# Patient Record
Sex: Female | Born: 1970 | Race: White | Hispanic: No | Marital: Married | Smoking: Current every day smoker
Health system: Southern US, Community
[De-identification: ages and names within clinical notes are randomized; demographics above are authoritative.]

## PROBLEM LIST (undated history)

## (undated) DIAGNOSIS — I10 Essential (primary) hypertension: Secondary | ICD-10-CM

## (undated) DIAGNOSIS — E1169 Type 2 diabetes mellitus with other specified complication: Principal | ICD-10-CM

## (undated) DIAGNOSIS — E669 Obesity, unspecified: Principal | ICD-10-CM

## (undated) HISTORY — PX: TONSILLECTOMY AND ADENOIDECTOMY: SUR1326

## (undated) HISTORY — DX: Type 2 diabetes mellitus with other specified complication: E11.69

## (undated) HISTORY — DX: Obesity, unspecified: E66.9

## (undated) HISTORY — DX: Essential (primary) hypertension: I10

---

## 2017-02-16 ENCOUNTER — Telehealth: Payer: Self-pay

## 2017-02-16 NOTE — Telephone Encounter (Signed)
Pre visit call attempted left message for return call.

## 2017-02-17 ENCOUNTER — Encounter: Payer: Self-pay | Admitting: Family Medicine

## 2017-02-17 ENCOUNTER — Ambulatory Visit (INDEPENDENT_AMBULATORY_CARE_PROVIDER_SITE_OTHER): Payer: BLUE CROSS/BLUE SHIELD | Admitting: Family Medicine

## 2017-02-17 VITALS — BP 122/84 | HR 78 | Temp 98.4°F | Ht 64.0 in | Wt 179.1 lb

## 2017-02-17 DIAGNOSIS — E669 Obesity, unspecified: Secondary | ICD-10-CM | POA: Diagnosis not present

## 2017-02-17 DIAGNOSIS — Z23 Encounter for immunization: Secondary | ICD-10-CM | POA: Diagnosis not present

## 2017-02-17 DIAGNOSIS — E1165 Type 2 diabetes mellitus with hyperglycemia: Secondary | ICD-10-CM | POA: Insufficient documentation

## 2017-02-17 DIAGNOSIS — E1169 Type 2 diabetes mellitus with other specified complication: Secondary | ICD-10-CM

## 2017-02-17 HISTORY — DX: Obesity, unspecified: E66.9

## 2017-02-17 HISTORY — DX: Type 2 diabetes mellitus with other specified complication: E11.69

## 2017-02-17 MED ORDER — ATORVASTATIN CALCIUM 40 MG PO TABS: 40.0000 mg | ORAL_TABLET | Freq: Every day | ORAL | 3 refills | Status: DC

## 2017-02-17 MED ORDER — METFORMIN HCL 500 MG PO TABS: ORAL_TABLET | ORAL | 1 refills | Status: DC

## 2017-02-17 NOTE — Addendum Note (Signed)
Addended by: Scharlene GlossEWING, Keishia Ground B on: 02/17/2017 04:21 PM   Modules accepted: Orders

## 2017-02-17 NOTE — Progress Notes (Signed)
Pre visit review using our clinic review tool, if applicable. No additional management support is needed unless otherwise documented below in the visit note. 

## 2017-02-17 NOTE — Addendum Note (Signed)
Addended by: Harley AltoPRICE, KRISTY M on: 02/17/2017 04:14 PM   Modules accepted: Orders

## 2017-02-17 NOTE — Patient Instructions (Addendum)
Check your sugars twice daily and write them down.  Give [place] 2-3 business days to get the results of your labs back. If labs are normal, you will likely receive a letter in the mail unless you have MyChart. This can take longer than 2-3 business days.   Let me know if you are ready to stop smoking and would like help.  Aim to do some physical exertion for 150 minutes per week. This is typically divided into 5 days per week, 30 minutes per day. The activity should be enough to get your heart rate up. Anything is better than nothing if you have time constraints.  If you do not hear anything about your referrals in the next 1-2 weeks, call our office and ask for an update.  Let us know if you need anything.  Healthy Eating Plan Many factors influence your heart health, including eating and exercise habits. Heart (coronary) risk increases with abnormal blood fat (lipid) levels. Heart-healthy meal planning includes limiting unhealthy fats, increasing healthy fats, and making other small dietary changes. This includes maintaining a healthy body weight to help keep lipid levels within a normal range.  WHAT IS MY PLAN?  Your health care provider recommends that you:  Drink a glass of water before meals to help with satiety.  Eat slowly.  An alternative to the water is to add Metamucil. This will help with satiety as well. It does contain calories, unlike water.  WHAT TYPES OF FAT SHOULD I CHOOSE?  Choose healthy fats more often. Choose monounsaturated and polyunsaturated fats, such as olive oil and canola oil, flaxseeds, walnuts, almonds, and seeds.  Eat more omega-3 fats. Good choices include salmon, mackerel, sardines, tuna, flaxseed oil, and ground flaxseeds. Aim to eat fish at least two times each week.  Avoid foods with partially hydrogenated oils in them. These contain trans fats. Examples of foods that contain trans fats are stick margarine, some tub margarines, cookies, crackers, and  other baked goods. If you are going to avoid a fat, this is the one to avoid!  WHAT GENERAL GUIDELINES DO I NEED TO FOLLOW?  Check food labels carefully to identify foods with trans fats. Avoid these types of options when possible.  Fill one half of your plate with vegetables and green salads. Eat 4-5 servings of vegetables per day. A serving of vegetables equals 1 cup of raw leafy vegetables,  cup of raw or cooked cut-up vegetables, or  cup of vegetable juice.  Fill one fourth of your plate with whole grains. Look for the word "whole" as the first word in the ingredient list.  Fill one fourth of your plate with lean protein foods.  Eat 4-5 servings of fruit per day. A serving of fruit equals one medium whole fruit,  cup of dried fruit,  cup of fresh, frozen, or canned fruit. Try to avoid fruits in cups/syrups as the sugar content can be high.  Eat more foods that contain soluble fiber. Examples of foods that contain this type of fiber are apples, broccoli, carrots, beans, peas, and barley. Aim to get 20-30 g of fiber per day.  Eat more home-cooked food and less restaurant, buffet, and fast food.  Limit or avoid alcohol.  Limit foods that are high in starch and sugar.  Avoid fried foods when able.  Cook foods by using methods other than frying. Baking, boiling, grilling, and broiling are all great options. Other fat-reducing suggestions include: ? Removing the skin from poultry. ? Removing all  visible fats from meats. ? Skimming the fat off of stews, soups, and gravies before serving them. ? Steaming vegetables in water or broth.  Lose weight if you are overweight. Losing just 5-10% of your initial body weight can help your overall health and prevent diseases such as diabetes and heart disease.  Increase your consumption of nuts, legumes, and seeds to 4-5 servings per week. One serving of dried beans or legumes equals  cup after being cooked, one serving of nuts equals 1 ounces,  and one serving of seeds equals  ounce or 1 tablespoon.  WHAT ARE GOOD FOODS CAN I EAT? Grains Grainy breads (try to find bread that is 3 g of fiber per slice or greater), oatmeal, light popcorn. Whole-grain cereals. Rice and pasta, including brown rice and those that are made with whole wheat. Edamame pasta is a great alternative to grain pasta. It has a higher protein content. Try to avoid significant consumption of white bread, sugary cereals, or pastries/baked goods.  Vegetables All vegetables. Cooked white potatoes do not count as vegetables.  Fruits All fruits, but limit pineapple and bananas as these fruits have a higher sugar content.  Meats and Other Protein Sources Lean, well-trimmed beef, veal, pork, and lamb. Chicken and [place] without skin. All fish and shellfish. Wild duck, rabbit, pheasant, and venison. Egg whites or low-cholesterol egg substitutes. Dried beans, peas, lentils, and tofu.Seeds and most nuts.  Dairy Low-fat or nonfat cheeses, including ricotta, string, and mozzarella. Skim or 1% milk that is liquid, powdered, or evaporated. Buttermilk that is made with low-fat milk. Nonfat or low-fat yogurt. Soy/Almond milk are good alternatives if you cannot handle dairy.  Beverages Water is the best for you. Sports drinks with less sugar are more desirable unless you are a highly active athlete.  Sweets and Desserts Sherbets and fruit ices. Honey, jam, marmalade, jelly, and syrups. Dark chocolate.  Eat all sweets and desserts in moderation.  Fats and Oils Nonhydrogenated (trans-free) margarines. Vegetable oils, including soybean, sesame, sunflower, olive, peanut, safflower, corn, canola, and cottonseed. Salad dressings or mayonnaise that are made with a vegetable oil. Limit added fats and oils that you use for cooking, baking, salads, and as spreads.  Other Cocoa powder. Coffee and tea. Most condiments.  The items listed above may not be a complete list of recommended  foods or beverages. Contact your dietitian for more options.

## 2017-02-17 NOTE — Progress Notes (Signed)
Chief Complaint  Patient presents with  . Establish Care       New Patient Visit SUBJECTIVE: HPI: Brooke Medina is an 46 y.o.female who is being seen for establishing care.  The patient was previously seen at Dr [facility]- solo practitioner.   Recently dx'd with diabetes by OB/GYN. Never had an eye exam since dx'd. No PCV23.  Flu shot 2 weeks ago. She does not have a glucometer. A1c in July, 2018 was 9.5. No medications and not on insulin. Diet is improving. She is physically active at work but no scheduled exercise.   Allergies  Allergen Reactions  . Latex   . Penicillins Rash  . Sulfa Antibiotics Rash    Past Medical History:  Diagnosis Date  . Diabetes mellitus type 2 in obese (HCC) 02/17/2017  . Hypertension     Past Surgical History:  Procedure Laterality Date  . TONSILLECTOMY AND ADENOIDECTOMY     Social History   Social History  . Marital status: Married   Social History Main Topics  . Smoking status: Current Every Day Smoker  . Smokeless tobacco: Never Used  . Alcohol use Yes  . Drug use: No   Family History  Problem Relation Age of Onset  . Diabetes Mother   . Hyperlipidemia Mother   . Hypertension Mother   . Kidney disease Father   . Diabetes Father   . Hypertension Father   . Heart murmur Father   . Diabetes Maternal Grandmother   . Diabetes Maternal Grandfather   . Hypertension Paternal Grandmother   . Heart disease Paternal Grandmother   . Diabetes Paternal Grandfather   . Hypertension Paternal Grandfather     Current Outpatient Prescriptions:  .  ALPRAZolam (XANAX) 0.25 MG tablet, Take 0.25 mg by mouth as needed for anxiety., Disp: , Rfl:  .  medroxyPROGESTERone (DEPO-PROVERA) 150 MG/ML injection, Inject 150 mg into the muscle every 3 (three) months., Disp: , Rfl:  .  atorvastatin (LIPITOR) 40 MG tablet, Take 1 tablet (40 mg total) by mouth daily., Disp: 90 tablet, Rfl: 3 .  metFORMIN (GLUCOPHAGE) 500 MG tablet, Week 1: 1 tab daily Week 2:  1 tab twice daily Week 3: 2 tabs in AM, 1 in evening Week 4: 2 tabs twice daily, Disp: 120 tablet, Rfl: 1  ROS Cardiovascular: Denies chest pain  Respiratory: Denies dyspnea   OBJECTIVE: BP 122/84 (BP Location: Left Arm, Patient Position: Sitting, Cuff Size: Normal)   Pulse 78   Temp 98.4 F (36.9 C) (Oral)   Ht 5\' 4"  (1.626 m)   Wt 179 lb 2 oz (81.3 kg)   SpO2 99%   BMI 30.75 kg/m   Constitutional: -  VS reviewed -  Well developed, well nourished, appears stated age -  No apparent distress  Psychiatric: -  Oriented to person, place, and time -  Memory intact -  Affect and mood normal -  Fluent conversation, good eye contact -  Judgment and insight age appropriate  Eye: -  Conjunctivae clear, no discharge -  Pupils symmetric, round, reactive to light  ENMT: -  MMM    Pharynx moist, no exudate, no erythema  Neck: -  No gross swelling, no palpable masses -  Thyroid midline, not enlarged, mobile, no palpable masses  Cardiovascular: -  RRR -  No LE edema  Respiratory: -  Normal respiratory effort, no accessory muscle use, no retraction -  Breath sounds equal, no wheezes, no ronchi, no crackles  Musculoskeletal: -  No clubbing,  no cyanosis -  Gait normal  Skin: -  No significant lesion on inspection -  Warm and dry to palpation   ASSESSMENT/PLAN: Diabetes mellitus type 2 in obese (HCC) - Plan: metFORMIN (GLUCOPHAGE) 500 MG tablet, atorvastatin (LIPITOR) 40 MG tablet, Ambulatory referral to Endocrinology, Microalbumin / creatinine urine ratio, Lipid panel, Hemoglobin A1c, Comprehensive metabolic panel, CBC, Ambulatory referral to Ophthalmology  Patient instructed to sign release of records form from her previous PCP. GYN rx's Xanax. 120 will last year.  Counseled on diet and exercise. Labs today. She is requesting referral to endo. Healthy diet hand out given.  Start statin and try Metformin.  Glucometer given today.  Patient should return prn as she follows with GYN for  yearly and will follow with Endo for DM. The patient voiced understanding and agreement to the plan.   Jilda Rocheicholas Paul NeiltonWendling, DO 02/17/17  4:10 PM

## 2017-02-18 LAB — CBC
HCT: 39.6 % (ref 35.0–45.0)
HEMOGLOBIN: 13.9 g/dL (ref 11.7–15.5)
MCH: 30.7 pg (ref 27.0–33.0)
MCHC: 35.1 g/dL (ref 32.0–36.0)
MCV: 87.4 fL (ref 80.0–100.0)
MPV: 9.7 fL (ref 7.5–12.5)
Platelets: 345 10*3/uL (ref 140–400)
RBC: 4.53 10*6/uL (ref 3.80–5.10)
RDW: 12 % (ref 11.0–15.0)
WBC: 12.7 10*3/uL — ABNORMAL HIGH (ref 3.8–10.8)

## 2017-02-18 LAB — COMPREHENSIVE METABOLIC PANEL
AG RATIO: 1.5 (calc) (ref 1.0–2.5)
ALT: 21 U/L (ref 6–29)
AST: 16 U/L (ref 10–30)
Albumin: 4.3 g/dL (ref 3.6–5.1)
Alkaline phosphatase (APISO): 53 U/L (ref 33–115)
BILIRUBIN TOTAL: 0.4 mg/dL (ref 0.2–1.2)
BUN: 19 mg/dL (ref 7–25)
CALCIUM: 9.6 mg/dL (ref 8.6–10.2)
CO2: 25 mmol/L (ref 20–32)
Chloride: 104 mmol/L (ref 98–110)
Creat: 0.73 mg/dL (ref 0.50–1.10)
GLUCOSE: 217 mg/dL — AB (ref 65–99)
Globulin: 2.8 g/dL (calc) (ref 1.9–3.7)
Potassium: 5.1 mmol/L (ref 3.5–5.3)
Sodium: 138 mmol/L (ref 135–146)
Total Protein: 7.1 g/dL (ref 6.1–8.1)

## 2017-02-18 LAB — HEMOGLOBIN A1C
EAG (MMOL/L): 11.1 (calc)
Hgb A1c MFr Bld: 8.6 % of total Hgb — ABNORMAL HIGH (ref <5.7)
Mean Plasma Glucose: 200 (calc)

## 2017-02-18 LAB — LIPID PANEL
Cholesterol: 186 mg/dL (ref <200)
HDL: 50 mg/dL — AB (ref >50)
LDL Cholesterol (Calc): 107 mg/dL (calc) — ABNORMAL HIGH
Non-HDL Cholesterol (Calc): 136 mg/dL (calc) — ABNORMAL HIGH (ref <130)
TRIGLYCERIDES: 174 mg/dL — AB (ref <150)
Total CHOL/HDL Ratio: 3.7 (calc) (ref <5.0)

## 2017-02-18 LAB — MICROALBUMIN / CREATININE URINE RATIO
Creatinine, Urine: 120 mg/dL (ref 20–275)
MICROALB/CREAT RATIO: 16 ug/mg{creat} (ref <30)
Microalb, Ur: 1.9 mg/dL

## 2017-02-20 ENCOUNTER — Other Ambulatory Visit: Payer: Self-pay | Admitting: Family Medicine

## 2017-02-20 DIAGNOSIS — D72829 Elevated white blood cell count, unspecified: Secondary | ICD-10-CM

## 2017-02-27 ENCOUNTER — Other Ambulatory Visit: Payer: Self-pay | Admitting: Family Medicine

## 2017-02-27 ENCOUNTER — Other Ambulatory Visit: Payer: BLUE CROSS/BLUE SHIELD

## 2017-02-27 ENCOUNTER — Other Ambulatory Visit (INDEPENDENT_AMBULATORY_CARE_PROVIDER_SITE_OTHER): Payer: BLUE CROSS/BLUE SHIELD

## 2017-02-27 DIAGNOSIS — D72829 Elevated white blood cell count, unspecified: Secondary | ICD-10-CM | POA: Diagnosis not present

## 2017-02-27 LAB — CBC WITH DIFFERENTIAL/PLATELET
BASOS PCT: 0.4 % (ref 0.0–3.0)
Basophils Absolute: 0.1 10*3/uL (ref 0.0–0.1)
Eosinophils Absolute: 0.1 10*3/uL (ref 0.0–0.7)
Eosinophils Relative: 0.5 % (ref 0.0–5.0)
HCT: 43.8 % (ref 36.0–46.0)
HEMOGLOBIN: 14.9 g/dL (ref 12.0–15.0)
LYMPHS ABS: 4.6 10*3/uL — AB (ref 0.7–4.0)
LYMPHS PCT: 33.3 % (ref 12.0–46.0)
MCHC: 33.9 g/dL (ref 30.0–36.0)
MCV: 90.2 fl (ref 78.0–100.0)
MONOS PCT: 6.6 % (ref 3.0–12.0)
Monocytes Absolute: 0.9 10*3/uL (ref 0.1–1.0)
NEUTROS ABS: 8.3 10*3/uL — AB (ref 1.4–7.7)
Neutrophils Relative %: 59.2 % (ref 43.0–77.0)
Platelets: 384 10*3/uL (ref 150.0–400.0)
RBC: 4.86 Mil/uL (ref 3.87–5.11)
RDW: 13.2 % (ref 11.5–15.5)
WBC: 14 10*3/uL — ABNORMAL HIGH (ref 4.0–10.5)

## 2017-02-28 ENCOUNTER — Other Ambulatory Visit: Payer: BLUE CROSS/BLUE SHIELD

## 2017-02-28 DIAGNOSIS — D729 Disorder of white blood cells, unspecified: Secondary | ICD-10-CM

## 2017-03-01 LAB — PATHOLOGIST SMEAR REVIEW

## 2017-03-08 ENCOUNTER — Telehealth: Payer: Self-pay | Admitting: Family Medicine

## 2017-03-08 NOTE — Telephone Encounter (Signed)
Copied from CRM #[id number]. Topic: Quick Communication - See Telephone Encounter >> Mar 08, 2017  9:33 AM Rudi CocoLathan, Othelia Riederer M, NT wrote: CRM for notification. See Telephone encounter for:   03/08/17. Pt. called to see if she can get a refill on methocarbamol 500mg  1 tablet twice a day for 7 days   [facility] [id number] - [place], [place] - [address] [facility] [address] [place] [id number] Phone: [phone] Fax: [phone] Open 24 hours

## 2017-03-08 NOTE — Telephone Encounter (Signed)
Why does she need this? I don't have it on her list. TY.
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
## 2017-04-19 ENCOUNTER — Encounter (HOSPITAL_BASED_OUTPATIENT_CLINIC_OR_DEPARTMENT_OTHER): Payer: Self-pay | Admitting: Emergency Medicine

## 2017-04-19 ENCOUNTER — Other Ambulatory Visit: Payer: Self-pay

## 2017-04-19 ENCOUNTER — Emergency Department (HOSPITAL_BASED_OUTPATIENT_CLINIC_OR_DEPARTMENT_OTHER)
Admission: EM | Admit: 2017-04-19 | Discharge: 2017-04-19 | Disposition: A | Discharge status: Home / Self Care | Payer: BLUE CROSS/BLUE SHIELD | Attending: Emergency Medicine | Admitting: Emergency Medicine

## 2017-04-19 DIAGNOSIS — Z79899 Other long term (current) drug therapy: Secondary | ICD-10-CM | POA: Diagnosis not present

## 2017-04-19 DIAGNOSIS — Z91040 Latex allergy status: Secondary | ICD-10-CM | POA: Diagnosis not present

## 2017-04-19 DIAGNOSIS — J029 Acute pharyngitis, unspecified: Secondary | ICD-10-CM | POA: Diagnosis present

## 2017-04-19 DIAGNOSIS — Z7984 Long term (current) use of oral hypoglycemic drugs: Secondary | ICD-10-CM | POA: Insufficient documentation

## 2017-04-19 DIAGNOSIS — J020 Streptococcal pharyngitis: Secondary | ICD-10-CM | POA: Insufficient documentation

## 2017-04-19 DIAGNOSIS — F17210 Nicotine dependence, cigarettes, uncomplicated: Secondary | ICD-10-CM | POA: Insufficient documentation

## 2017-04-19 DIAGNOSIS — E119 Type 2 diabetes mellitus without complications: Secondary | ICD-10-CM | POA: Diagnosis not present

## 2017-04-19 DIAGNOSIS — I10 Essential (primary) hypertension: Secondary | ICD-10-CM | POA: Diagnosis not present

## 2017-04-19 LAB — RAPID STREP SCREEN (MED CTR MEBANE ONLY): Streptococcus, Group A Screen (Direct): POSITIVE — AB

## 2017-04-19 MED ORDER — CHLORHEXIDINE GLUCONATE 0.12 % MT SOLN: 15.0000 mL | Freq: Two times a day (BID) | OROMUCOSAL | 0 refills | Status: DC

## 2017-04-19 MED ORDER — CLINDAMYCIN HCL 150 MG PO CAPS: 150.0000 mg | ORAL_CAPSULE | Freq: Four times a day (QID) | ORAL | 0 refills | Status: DC

## 2017-04-19 MED FILL — CHLORHEXIDINE 0.12% RINSE: 0.12 | 16 days supply | Qty: 473 | Fill #0

## 2017-04-19 MED FILL — CLINDAMYCIN HCL 150 MG CAPS: 150 | 7 days supply | Qty: 28 | Fill #0

## 2017-04-19 NOTE — ED Provider Notes (Signed)
[facility] [place] EMERGENCY DEPARTMENT Provider Note   CSN: [id number] Arrival date & time: 04/19/17  0800     History   Chief Complaint Chief Complaint  Patient presents with  . Sore Throat    HPI Brooke Medina is a 47 y.o. female.  HPI   47 year old obese female with history of diabetes and hypertension presenting for evaluation of a sore throat.  For the past 3-4 days she has had progressive worsening sore throat, cough productive with yellow sputum, fever as high as 101, and generalized body aches.  Symptoms mildly improved with Tylenol at home.  No report of ear pain, runny nose, sneezing, shortness of breath, abdominal cramping, nausea vomiting or diarrhea.  Prior history of tonsillectomy and adenectomy.  Denies any recent sick contact.  Patient is a half a pack day smoker.  Past Medical History:  Diagnosis Date  . Diabetes mellitus type 2 in obese (HCC) 02/17/2017  . Hypertension     Patient Active Problem List   Diagnosis Date Noted  . Diabetes mellitus type 2 in obese (HCC) 02/17/2017    Past Surgical History:  Procedure Laterality Date  . TONSILLECTOMY AND ADENOIDECTOMY      OB History    No data available       Home Medications    Prior to Admission medications   Medication Sig Start Date End Date Taking? Authorizing Provider  ALPRAZolam Prudy Feeler(XANAX) 0.25 MG tablet Take 0.25 mg by mouth as needed for anxiety.    [provider]  ALPRAZolam Prudy Feeler(XANAX) 0.5 MG tablet Take 0.5 mg at bedtime as needed by mouth for anxiety.    [provider]  atenolol-chlorthalidone (TENORETIC) 50-25 MG tablet Take 1 tablet daily by mouth.    [provider]  atorvastatin (LIPITOR) 40 MG tablet Take 1 tablet (40 mg total) by mouth daily. 02/17/17   Sharlene DoryWendling, Nicholas Paul, DO  medroxyPROGESTERone (DEPO-PROVERA) 150 MG/ML injection Inject 150 mg into the muscle every 3 (three) months.    [provider]  metFORMIN (GLUCOPHAGE) 500 MG tablet  Week 1: 1 tab daily Week 2: 1 tab twice daily Week 3: 2 tabs in AM, 1 in evening Week 4: 2 tabs twice daily 02/17/17   Sharlene DoryWendling, Nicholas Paul, DO    Family History Family History  Problem Relation Age of Onset  . Diabetes Mother   . Hyperlipidemia Mother   . Hypertension Mother   . Kidney disease Father   . Diabetes Father   . Hypertension Father   . Heart murmur Father   . Diabetes Maternal Grandmother   . Diabetes Maternal Grandfather   . Hypertension Paternal Grandmother   . Heart disease Paternal Grandmother   . Diabetes Paternal Grandfather   . Hypertension Paternal Grandfather     Social History Social History   Tobacco Use  . Smoking status: Current Every Day Smoker    Packs/day: 0.50  . Smokeless tobacco: Never Used  Substance Use Topics  . Alcohol use: Yes  . Drug use: No     Allergies   Latex; Penicillins; and Sulfa antibiotics   Review of Systems Review of Systems  All other systems reviewed and are negative.    Physical Exam Updated Vital Signs BP (!) 129/92 (BP Location: Right Arm)   Pulse 90   Temp 98.5 F (36.9 C) (Oral)   Resp 18   Ht 5\' 4"  (1.626 m)   Wt 79.4 kg (175 lb)   SpO2 95%   BMI 30.04 kg/m  Physical Exam  Constitutional: She appears well-developed and well-nourished. No distress.  HENT:  Head: Atraumatic.  Right Ear: Tympanic membrane normal.  Left Ear: Tympanic membrane normal.  Mouth/Throat: Mucous membranes are normal. No uvula swelling. Posterior oropharyngeal erythema present. No oropharyngeal exudate, posterior oropharyngeal edema or tonsillar abscesses. No tonsillar exudate.  Eyes: Conjunctivae are normal.  Neck: Neck supple.  Cardiovascular: Normal rate and regular rhythm.  Pulmonary/Chest: Effort normal and breath sounds normal.  Abdominal: Soft. There is no tenderness.  Lymphadenopathy:    She has cervical adenopathy.  Neurological: She is alert.  Skin: No rash noted.  Psychiatric: She has a normal mood and  affect.  Nursing note and vitals reviewed.    ED Treatments / Results  Labs (all labs ordered are listed, but only abnormal results are displayed) Labs Reviewed  RAPID STREP SCREEN (NOT AT [facility]) - Abnormal; Notable for the following components:      Result Value   Streptococcus, Group A Screen (Direct) POSITIVE (*)    All other components within normal limits    EKG  EKG Interpretation None       Radiology No results found.  Procedures Procedures (including critical care time)  Medications Ordered in ED Medications - No data to display   Initial Impression / Assessment and Plan / ED Course  I have reviewed the triage vital signs and the nursing notes.  Pertinent labs & imaging results that were available during my care of the patient were reviewed by me and considered in my medical decision making (see chart for details).     BP (!) 129/92 (BP Location: Right Arm)   Pulse 90   Temp 98.5 F (36.9 C) (Oral)   Resp 18   Ht 5\' 4"  (1.626 m)   Wt 79.4 kg (175 lb)   SpO2 95%   BMI 30.04 kg/m    Final Clinical Impressions(s) / ED Diagnoses   Final diagnoses:  Strep throat    ED Discharge Orders        Ordered    chlorhexidine (PERIDEX) 0.12 % solution  2 times daily     04/19/17 0943    clindamycin (CLEOCIN) 150 MG capsule  Every 6 hours     04/19/17 0944     9:42 AM Patient presents with sore throat.  Strep test positive. Prior tonsillectomy.  On exam, no evidence of deep tissue infection such as peritonsillar abscess or Ludwig's angina.  Normal phonation, swallow her saliva without difficulty.  Patient is allergic to penicillin therefore will treat with clindamycin, and Peridex.  Return precautions discussed.   Fayrene Helper, PA-C 04/19/17 1610    Raeford Razor, MD 04/19/17 [phone]

## 2017-04-19 NOTE — ED Triage Notes (Signed)
Patient reports sore throat since Sunday night.  Reports fevers as well.

## 2017-05-03 ENCOUNTER — Ambulatory Visit: Payer: BLUE CROSS/BLUE SHIELD | Admitting: Internal Medicine

## 2017-05-22 ENCOUNTER — Encounter: Payer: Self-pay | Admitting: Internal Medicine

## 2017-06-08 ENCOUNTER — Encounter: Payer: Self-pay | Admitting: Endocrinology

## 2017-09-19 ENCOUNTER — Other Ambulatory Visit: Payer: Self-pay | Admitting: Family Medicine

## 2017-09-19 DIAGNOSIS — E1169 Type 2 diabetes mellitus with other specified complication: Secondary | ICD-10-CM

## 2017-09-19 DIAGNOSIS — E669 Obesity, unspecified: Principal | ICD-10-CM

## 2018-01-18 DIAGNOSIS — Z124 Encounter for screening for malignant neoplasm of cervix: Secondary | ICD-10-CM | POA: Diagnosis not present

## 2018-01-18 DIAGNOSIS — Z3042 Encounter for surveillance of injectable contraceptive: Secondary | ICD-10-CM | POA: Diagnosis not present

## 2018-01-18 DIAGNOSIS — Z01419 Encounter for gynecological examination (general) (routine) without abnormal findings: Secondary | ICD-10-CM | POA: Diagnosis not present

## 2018-01-18 DIAGNOSIS — Z1151 Encounter for screening for human papillomavirus (HPV): Secondary | ICD-10-CM | POA: Diagnosis not present

## 2018-04-09 DIAGNOSIS — Z3042 Encounter for surveillance of injectable contraceptive: Secondary | ICD-10-CM | POA: Diagnosis not present

## 2018-07-02 DIAGNOSIS — Z3042 Encounter for surveillance of injectable contraceptive: Secondary | ICD-10-CM | POA: Diagnosis not present

## 2018-08-05 ENCOUNTER — Encounter (HOSPITAL_BASED_OUTPATIENT_CLINIC_OR_DEPARTMENT_OTHER): Payer: Self-pay | Admitting: *Deleted

## 2018-08-05 ENCOUNTER — Other Ambulatory Visit: Payer: Self-pay

## 2018-08-05 ENCOUNTER — Emergency Department (HOSPITAL_BASED_OUTPATIENT_CLINIC_OR_DEPARTMENT_OTHER)
Admission: EM | Admit: 2018-08-05 | Discharge: 2018-08-05 | Disposition: A | Discharge status: Home / Self Care | Payer: BLUE CROSS/BLUE SHIELD | Attending: Emergency Medicine | Admitting: Emergency Medicine

## 2018-08-05 DIAGNOSIS — L237 Allergic contact dermatitis due to plants, except food: Secondary | ICD-10-CM

## 2018-08-05 DIAGNOSIS — Z79899 Other long term (current) drug therapy: Secondary | ICD-10-CM | POA: Insufficient documentation

## 2018-08-05 DIAGNOSIS — Z91040 Latex allergy status: Secondary | ICD-10-CM | POA: Insufficient documentation

## 2018-08-05 DIAGNOSIS — F17210 Nicotine dependence, cigarettes, uncomplicated: Secondary | ICD-10-CM | POA: Diagnosis not present

## 2018-08-05 DIAGNOSIS — E119 Type 2 diabetes mellitus without complications: Secondary | ICD-10-CM | POA: Diagnosis not present

## 2018-08-05 DIAGNOSIS — Z7984 Long term (current) use of oral hypoglycemic drugs: Secondary | ICD-10-CM | POA: Diagnosis not present

## 2018-08-05 DIAGNOSIS — I10 Essential (primary) hypertension: Secondary | ICD-10-CM | POA: Diagnosis not present

## 2018-08-05 DIAGNOSIS — R21 Rash and other nonspecific skin eruption: Secondary | ICD-10-CM | POA: Diagnosis not present

## 2018-08-05 MED ORDER — PREDNISONE 10 MG PO TABS
60.0000 mg | ORAL_TABLET | Freq: Once | ORAL | Status: AC
  Administered 2018-08-05: 19:00:00 60 mg via ORAL
  Filled 2018-08-05: qty 1

## 2018-08-05 MED ORDER — PREDNISONE 20 MG PO TABS: ORAL_TABLET | ORAL | 0 refills | Status: DC

## 2018-08-05 NOTE — ED Triage Notes (Signed)
Pt reports she has poison ivy and is concerned because it is on her face

## 2018-08-05 NOTE — ED Provider Notes (Signed)
[facility] EMERGENCY DEPARTMENT Provider Note   CSN: [id number] Arrival date & time: 08/05/18  1815    History   Chief Complaint Chief Complaint  Patient presents with  . Rash    HPI Beverley FiedlerChristine Vandunk is a 48 y.o. female hx of DM, HTN, here presenting with possible poison ivy.  Patient states that she went to her grandmother's house several days ago and may have been in contact with some poison ivy.  She noticed some rash on her forearms about 5 days ago.  She states that about 3 days she noticed some swelling on the right side of her face.  She was concerned that she may have some poison ivy.  She took some Benadryl with minimal relief.  She denies any fevers or chills or new shampoos or medicines.      The history is provided by the patient.    Past Medical History:  Diagnosis Date  . Diabetes mellitus type 2 in obese (HCC) 02/17/2017  . Hypertension     Patient Active Problem List   Diagnosis Date Noted  . Diabetes mellitus type 2 in obese (HCC) 02/17/2017    Past Surgical History:  Procedure Laterality Date  . TONSILLECTOMY AND ADENOIDECTOMY       OB History   No obstetric history on file.      Home Medications    Prior to Admission medications   Medication Sig Start Date End Date Taking? Authorizing Provider  ALPRAZolam Prudy Feeler(XANAX) 0.25 MG tablet Take 0.25 mg by mouth as needed for anxiety.    [provider]  ALPRAZolam Prudy Feeler(XANAX) 0.5 MG tablet Take 0.5 mg at bedtime as needed by mouth for anxiety.    [provider]  atenolol-chlorthalidone (TENORETIC) 50-25 MG tablet Take 1 tablet daily by mouth.    [provider]  atorvastatin (LIPITOR) 40 MG tablet Take 1 tablet (40 mg total) by mouth daily. 02/17/17   Sharlene DoryWendling, Nicholas Paul, DO  chlorhexidine (PERIDEX) 0.12 % solution Use as directed 15 mLs in the mouth or throat 2 (two) times daily. 04/19/17   Fayrene Helperran, [place], PA-C  clindamycin (CLEOCIN) 150 MG capsule Take 1 capsule (150 mg  total) by mouth every 6 (six) hours. 04/19/17   Fayrene Helperran, [place], PA-C  medroxyPROGESTERone (DEPO-PROVERA) 150 MG/ML injection Inject 150 mg into the muscle every 3 (three) months.    [provider]  metFORMIN (GLUCOPHAGE) 500 MG tablet SEE NOTES 09/20/17   Sharlene DoryWendling, Nicholas Paul, DO    Family History Family History  Problem Relation Age of Onset  . Diabetes Mother   . Hyperlipidemia Mother   . Hypertension Mother   . Kidney disease Father   . Diabetes Father   . Hypertension Father   . Heart murmur Father   . Diabetes Maternal Grandmother   . Diabetes Maternal Grandfather   . Hypertension Paternal Grandmother   . Heart disease Paternal Grandmother   . Diabetes Paternal Grandfather   . Hypertension Paternal Grandfather     Social History Social History   Tobacco Use  . Smoking status: Current Every Day Smoker    Packs/day: 0.50  . Smokeless tobacco: Never Used  Substance Use Topics  . Alcohol use: Yes  . Drug use: No     Allergies   Latex; Penicillins; and Sulfa antibiotics   Review of Systems Review of Systems  Skin: Positive for rash.  All other systems reviewed and are negative.    Physical Exam Updated Vital Signs BP (!) 120/92 (BP  Location: Left Arm)   Pulse 76   Temp 98.3 F (36.8 C) (Oral)   Resp 16   Ht 5\' 5"  (1.651 m)   Wt 77.1 kg   SpO2 99%   BMI 28.29 kg/m   Physical Exam Vitals signs and nursing note reviewed.  HENT:     Head: Normocephalic.     Comments: Some urticaria R side of face. No involvement of the oral mucosa.     Nose: Nose normal.     Mouth/Throat:     Mouth: Mucous membranes are moist.  Eyes:     Pupils: Pupils are equal, round, and reactive to light.  Neck:     Musculoskeletal: Normal range of motion.  Cardiovascular:     Rate and Rhythm: Normal rate.     Pulses: Normal pulses.     Heart sounds: Normal heart sounds.  Pulmonary:     Effort: Pulmonary effort is normal.  Abdominal:     General: Abdomen is flat.   Musculoskeletal: Normal range of motion.  Skin:    General: Skin is warm.     Comments: Urticaria on bilateral forearms, no superimposed cellulitis   Neurological:     General: No focal deficit present.     Mental Status: She is alert.  Psychiatric:        Mood and Affect: Mood normal.      ED Treatments / Results  Labs (all labs ordered are listed, but only abnormal results are displayed) Labs Reviewed - No data to display  EKG None  Radiology No results found.  Procedures Procedures (including critical care time)  Medications Ordered in ED Medications  predniSONE (DELTASONE) tablet 60 mg (has no administration in time range)     Initial Impression / Assessment and Plan / ED Course  I have reviewed the triage vital signs and the nursing notes.  Pertinent labs & imaging results that were available during my care of the patient were reviewed by me and considered in my medical decision making (see chart for details).       Marianna Mcgahan is a 47 y.o. female here with urticaria. Possible exposure to poison Ivy. Afebrile, no concern for SJS. Will dc home with 10 days of steroids. She has benadryl at home.    Final Clinical Impressions(s) / ED Diagnoses   Final diagnoses:  None    ED Discharge Orders    None       Charlynne Pander, MD 08/05/18 [phone]

## 2018-08-05 NOTE — Discharge Instructions (Signed)
Take prednisone for the last 9 days as prescribed.   Take benadryl 25 mg every 6 hrs as needed if you are itchy   See your doctor  Return to ER if you have worse rash, fever, trouble breathing.

## 2018-09-26 DIAGNOSIS — Z3202 Encounter for pregnancy test, result negative: Secondary | ICD-10-CM | POA: Diagnosis not present

## 2018-09-26 DIAGNOSIS — Z3042 Encounter for surveillance of injectable contraceptive: Secondary | ICD-10-CM | POA: Diagnosis not present

## 2018-12-19 ENCOUNTER — Other Ambulatory Visit: Payer: Self-pay | Admitting: Family Medicine

## 2018-12-19 DIAGNOSIS — E1169 Type 2 diabetes mellitus with other specified complication: Secondary | ICD-10-CM

## 2018-12-20 DIAGNOSIS — Z3042 Encounter for surveillance of injectable contraceptive: Secondary | ICD-10-CM | POA: Diagnosis not present

## 2018-12-20 DIAGNOSIS — Z3202 Encounter for pregnancy test, result negative: Secondary | ICD-10-CM | POA: Diagnosis not present

## 2019-01-31 DIAGNOSIS — Z23 Encounter for immunization: Secondary | ICD-10-CM | POA: Diagnosis not present

## 2019-01-31 DIAGNOSIS — F411 Generalized anxiety disorder: Secondary | ICD-10-CM | POA: Diagnosis not present

## 2019-01-31 DIAGNOSIS — Z3042 Encounter for surveillance of injectable contraceptive: Secondary | ICD-10-CM | POA: Diagnosis not present

## 2019-01-31 DIAGNOSIS — N898 Other specified noninflammatory disorders of vagina: Secondary | ICD-10-CM | POA: Diagnosis not present

## 2019-01-31 DIAGNOSIS — Z01419 Encounter for gynecological examination (general) (routine) without abnormal findings: Secondary | ICD-10-CM | POA: Diagnosis not present

## 2019-03-11 DIAGNOSIS — Z3042 Encounter for surveillance of injectable contraceptive: Secondary | ICD-10-CM | POA: Diagnosis not present

## 2019-05-30 DIAGNOSIS — Z3042 Encounter for surveillance of injectable contraceptive: Secondary | ICD-10-CM | POA: Diagnosis not present

## 2019-07-29 ENCOUNTER — Other Ambulatory Visit: Payer: Self-pay

## 2019-07-31 ENCOUNTER — Other Ambulatory Visit: Payer: Self-pay

## 2019-07-31 ENCOUNTER — Ambulatory Visit: Payer: BC Managed Care – PPO | Admitting: Family Medicine

## 2019-07-31 ENCOUNTER — Encounter: Payer: Self-pay | Admitting: Family Medicine

## 2019-07-31 VITALS — BP 134/84 | HR 82 | Temp 96.2°F | Ht 65.0 in | Wt 172.2 lb

## 2019-07-31 DIAGNOSIS — E1169 Type 2 diabetes mellitus with other specified complication: Secondary | ICD-10-CM | POA: Diagnosis not present

## 2019-07-31 DIAGNOSIS — E669 Obesity, unspecified: Secondary | ICD-10-CM

## 2019-07-31 DIAGNOSIS — Z23 Encounter for immunization: Secondary | ICD-10-CM

## 2019-07-31 DIAGNOSIS — E1165 Type 2 diabetes mellitus with hyperglycemia: Secondary | ICD-10-CM | POA: Diagnosis not present

## 2019-07-31 DIAGNOSIS — Z0000 Encounter for general adult medical examination without abnormal findings: Secondary | ICD-10-CM

## 2019-07-31 MED ORDER — ATENOLOL-CHLORTHALIDONE 50-25 MG PO TABS: 1.0000 | ORAL_TABLET | Freq: Every day | ORAL | 7 refills | Status: DC

## 2019-07-31 MED ORDER — ATORVASTATIN CALCIUM 40 MG PO TABS: 40.0000 mg | ORAL_TABLET | Freq: Every day | ORAL | 3 refills | Status: DC

## 2019-07-31 NOTE — Progress Notes (Signed)
Chief Complaint  Patient presents with  . Annual Exam     Well Woman Brooke Medina is here for a complete physical.   Her last physical was >1 year ago.  Current diet: in general, a "healthy" diet. Current exercise: some aerobics. Weight is stable and she denies daytime fatigue. Seatbelt? Yes  Health Maintenance Pap/HPV- Yes Tetanus- Yes HIV screening- Yes  Past Medical History:  Diagnosis Date  . Diabetes mellitus type 2 in obese (HCC) 02/17/2017  . Hypertension      Past Surgical History:  Procedure Laterality Date  . TONSILLECTOMY AND ADENOIDECTOMY      Medications  Current Outpatient Medications on File Prior to Visit  Medication Sig Dispense Refill  . ALPRAZolam (XANAX) 0.5 MG tablet Take 0.5 mg at bedtime as needed by mouth for anxiety.    . medroxyPROGESTERone (DEPO-PROVERA) 150 MG/ML injection Inject 150 mg into the muscle every 3 (three) months.      Allergies Allergies  Allergen Reactions  . Latex   . Penicillins Rash  . Sulfa Antibiotics Rash    Review of Systems: Constitutional:  no unexpected weight changes Eye:  no recent significant change in vision Ear/Nose/Mouth/Throat:  Ears:  no recent change in hearing Nose/Mouth/Throat:  no complaints of nasal congestion, no sore throat Cardiovascular: no chest pain Respiratory:  no shortness of breath Gastrointestinal:  no abdominal pain, no change in bowel habits GU:  Female: negative for dysuria or pelvic pain Musculoskeletal/Extremities: +leg pain; no pain of the joints Integumentary (Skin/Breast):  no abnormal skin lesions reported Neurologic:  no headaches Endocrine:  denies fatigue Hematologic/Lymphatic:  No areas of easy bleeding  Exam BP 134/84 (BP Location: Right Arm, Patient Position: Sitting, Cuff Size: Normal)   Pulse 82   Temp (!) 96.2 F (35.7 C) (Temporal)   Ht 5\' 5"  (1.651 m)   Wt 172 lb 4 oz (78.1 kg)   SpO2 97%   BMI 28.66 kg/m  General:  well developed, well nourished, in  no apparent distress Skin:  no significant moles, warts, or growths Head:  no masses, lesions, or tenderness Eyes:  pupils equal and round, sclera anicteric without injection Ears:  canals without lesions, TMs shiny without retraction, no obvious effusion, no erythema Nose:  nares patent, septum midline, mucosa normal, and no drainage or sinus tenderness Throat/Pharynx:  lips and gingiva without lesion; tongue and uvula midline; non-inflamed pharynx; no exudates or postnasal drainage Neck: neck supple without adenopathy, thyromegaly, or masses Lungs:  clear to auscultation, breath sounds equal bilaterally, no respiratory distress Cardio:  regular rate and rhythm, no LE edema Abdomen:  abdomen soft, nontender; bowel sounds normal; no masses or organomegaly Genital: Defer to GYN Musculoskeletal: mild ttp over TA and tibia bl; symmetrical muscle groups noted without atrophy or deformity Extremities:  no clubbing, cyanosis, or edema, no deformities, no skin discoloration Neuro:  gait normal; deep tendon reflexes normal and symmetric Psych: well oriented with normal range of affect and appropriate judgment/insight  Assessment and Plan  Well adult exam - Plan: CBC, Comprehensive metabolic panel, Lipid panel, VITAMIN D 25 Hydroxy (Vit-D Deficiency, Fractures)  Type 2 diabetes mellitus with hyperglycemia, without long-term current use of insulin (HCC) - Plan: Hemoglobin A1c, Microalbumin / creatinine urine ratio  Diabetes mellitus type 2 in obese (HCC) - Plan: atorvastatin (LIPITOR) 40 MG tablet   Well 49 y.o. female. Counseled on diet and exercise. GYN info for across hall provided. Needs to sched eye exam. Arch support.  Tdap today. Other orders  as above.  Follow up pending above results (3-6 mo). The patient voiced understanding and agreement to the plan.  [place], DO 07/31/19 2:59 PM

## 2019-07-31 NOTE — Patient Instructions (Addendum)
Give [place] 2-3 business days to get the results of your labs back. Our follow up hinges on your lab results.   Keep the diet clean and stay active.  Schedule an appointment with your eye doctor.  No covid vaccine for 2 weeks since you are getting a shot today.   Call Center for [facility] at [phone] for an appointment.  They are located at [address], [address], [place], [place], [address] (right across the hall from our office).  Consider supportive shoes. Also consider arch support. Dr Margart Sickles is an option or Powerstep is a higher quality option.   Let us know if you need anything.

## 2019-07-31 NOTE — Addendum Note (Signed)
Addended by: Scharlene Gloss B on: 07/31/2019 03:09 PM   Modules accepted: Orders

## 2019-08-01 ENCOUNTER — Other Ambulatory Visit: Payer: Self-pay | Admitting: Family Medicine

## 2019-08-01 LAB — CBC
HCT: 42.6 % (ref 36.0–46.0)
Hemoglobin: 14.8 g/dL (ref 12.0–15.0)
MCHC: 34.6 g/dL (ref 30.0–36.0)
MCV: 89.7 fl (ref 78.0–100.0)
Platelets: 334 10*3/uL (ref 150.0–400.0)
RBC: 4.75 Mil/uL (ref 3.87–5.11)
RDW: 13.2 % (ref 11.5–15.5)
WBC: 12.2 10*3/uL — ABNORMAL HIGH (ref 4.0–10.5)

## 2019-08-01 LAB — LIPID PANEL
Cholesterol: 193 mg/dL (ref 0–200)
HDL: 43.8 mg/dL (ref >39.00)
NonHDL: 148.85
Total CHOL/HDL Ratio: 4
Triglycerides: 264 mg/dL — ABNORMAL HIGH (ref 0.0–149.0)
VLDL: 52.8 mg/dL — ABNORMAL HIGH (ref 0.0–40.0)

## 2019-08-01 LAB — COMPREHENSIVE METABOLIC PANEL
ALT: 26 U/L (ref 0–35)
AST: 16 U/L (ref 0–37)
Albumin: 4.6 g/dL (ref 3.5–5.2)
Alkaline Phosphatase: 61 U/L (ref 39–117)
BUN: 26 mg/dL — ABNORMAL HIGH (ref 6–23)
CO2: 26 mEq/L (ref 19–32)
Calcium: 10 mg/dL (ref 8.4–10.5)
Chloride: 101 mEq/L (ref 96–112)
Creatinine, Ser: 0.97 mg/dL (ref 0.40–1.20)
GFR: 61.65 mL/min (ref >60.00)
Glucose, Bld: 197 mg/dL — ABNORMAL HIGH (ref 70–99)
Potassium: 3.8 mEq/L (ref 3.5–5.1)
Sodium: 137 mEq/L (ref 135–145)
Total Bilirubin: 0.6 mg/dL (ref 0.2–1.2)
Total Protein: 6.9 g/dL (ref 6.0–8.3)

## 2019-08-01 LAB — MICROALBUMIN / CREATININE URINE RATIO
Creatinine,U: 164.6 mg/dL
Microalb Creat Ratio: 2.1 mg/g (ref 0.0–30.0)
Microalb, Ur: 3.5 mg/dL — ABNORMAL HIGH (ref 0.0–1.9)

## 2019-08-01 LAB — LDL CHOLESTEROL, DIRECT: Direct LDL: 103 mg/dL

## 2019-08-01 LAB — HEMOGLOBIN A1C: Hgb A1c MFr Bld: 8.8 % — ABNORMAL HIGH (ref 4.6–6.5)

## 2019-08-01 LAB — VITAMIN D 25 HYDROXY (VIT D DEFICIENCY, FRACTURES): VITD: 19.6 ng/mL — ABNORMAL LOW (ref 30.00–100.00)

## 2019-08-01 MED ORDER — VITAMIN D (ERGOCALCIFEROL) 1.25 MG (50000 UNIT) PO CAPS: 50000.0000 [IU] | ORAL_CAPSULE | ORAL | 0 refills | Status: DC

## 2019-08-01 MED ORDER — METFORMIN HCL 500 MG PO TABS: ORAL_TABLET | ORAL | 1 refills | Status: DC

## 2019-08-02 ENCOUNTER — Other Ambulatory Visit: Payer: Self-pay | Admitting: Family Medicine

## 2019-08-02 DIAGNOSIS — D72829 Elevated white blood cell count, unspecified: Secondary | ICD-10-CM

## 2019-08-02 DIAGNOSIS — E7849 Other hyperlipidemia: Secondary | ICD-10-CM

## 2019-08-21 DIAGNOSIS — Z3042 Encounter for surveillance of injectable contraceptive: Secondary | ICD-10-CM | POA: Diagnosis not present

## 2019-09-11 NOTE — Addendum Note (Signed)
Addended by: Mervin Kung A on: 09/11/2019 02:54 PM   Modules accepted: Orders

## 2019-09-13 ENCOUNTER — Other Ambulatory Visit: Payer: Self-pay

## 2019-09-13 ENCOUNTER — Other Ambulatory Visit (INDEPENDENT_AMBULATORY_CARE_PROVIDER_SITE_OTHER): Payer: BC Managed Care – PPO

## 2019-09-13 DIAGNOSIS — D72829 Elevated white blood cell count, unspecified: Secondary | ICD-10-CM

## 2019-09-13 DIAGNOSIS — E7849 Other hyperlipidemia: Secondary | ICD-10-CM | POA: Diagnosis not present

## 2019-09-17 ENCOUNTER — Other Ambulatory Visit: Payer: Self-pay | Admitting: Family Medicine

## 2019-09-17 LAB — CBC (INCLUDES DIFF/PLT) WITH PATHOLOGIST REVIEW
Absolute Monocytes: 1074 cells/uL — ABNORMAL HIGH (ref 200–950)
Basophils Absolute: 39 cells/uL (ref 0–200)
Basophils Relative: 0.3 %
Eosinophils Absolute: 79 cells/uL (ref 15–500)
Eosinophils Relative: 0.6 %
HCT: 42.3 % (ref 35.0–45.0)
Hemoglobin: 14.4 g/dL (ref 11.7–15.5)
Lymphs Abs: 4755 cells/uL — ABNORMAL HIGH (ref 850–3900)
MCH: 30.7 pg (ref 27.0–33.0)
MCHC: 34 g/dL (ref 32.0–36.0)
MCV: 90.2 fL (ref 80.0–100.0)
MPV: 9.7 fL (ref 7.5–12.5)
Monocytes Relative: 8.2 %
Neutro Abs: 7153 cells/uL (ref 1500–7800)
Neutrophils Relative %: 54.6 %
Platelets: 335 10*3/uL (ref 140–400)
RBC: 4.69 10*6/uL (ref 3.80–5.10)
RDW: 12.5 % (ref 11.0–15.0)
Total Lymphocyte: 36.3 %
WBC: 13.1 10*3/uL — ABNORMAL HIGH (ref 3.8–10.8)

## 2019-09-17 LAB — LIPID PANEL
Cholesterol: 134 mg/dL (ref <200)
HDL: 41 mg/dL — ABNORMAL LOW (ref >=50)
LDL Cholesterol (Calc): 67 mg/dL (calc)
Non-HDL Cholesterol (Calc): 93 mg/dL (calc) (ref <130)
Total CHOL/HDL Ratio: 3.3 (calc) (ref <5.0)
Triglycerides: 181 mg/dL — ABNORMAL HIGH (ref <150)

## 2019-09-18 ENCOUNTER — Other Ambulatory Visit: Payer: Self-pay | Admitting: Family Medicine

## 2019-09-18 DIAGNOSIS — D72829 Elevated white blood cell count, unspecified: Secondary | ICD-10-CM

## 2019-10-02 ENCOUNTER — Other Ambulatory Visit (INDEPENDENT_AMBULATORY_CARE_PROVIDER_SITE_OTHER): Payer: BC Managed Care – PPO

## 2019-10-02 ENCOUNTER — Other Ambulatory Visit: Payer: Self-pay

## 2019-10-02 DIAGNOSIS — D72829 Elevated white blood cell count, unspecified: Secondary | ICD-10-CM

## 2019-10-03 LAB — CBC (INCLUDES DIFF/PLT) WITH PATHOLOGIST REVIEW
Absolute Monocytes: 1087 cells/uL — ABNORMAL HIGH (ref 200–950)
Basophils Absolute: 43 cells/uL (ref 0–200)
Basophils Relative: 0.3 %
Eosinophils Absolute: 143 cells/uL (ref 15–500)
Eosinophils Relative: 1 %
HCT: 43.4 % (ref 35.0–45.0)
Hemoglobin: 14.6 g/dL (ref 11.7–15.5)
Lymphs Abs: 5277 cells/uL — ABNORMAL HIGH (ref 850–3900)
MCH: 30.2 pg (ref 27.0–33.0)
MCHC: 33.6 g/dL (ref 32.0–36.0)
MCV: 89.9 fL (ref 80.0–100.0)
MPV: 9.8 fL (ref 7.5–12.5)
Monocytes Relative: 7.6 %
Neutro Abs: 7751 cells/uL (ref 1500–7800)
Neutrophils Relative %: 54.2 %
Platelets: 365 10*3/uL (ref 140–400)
RBC: 4.83 10*6/uL (ref 3.80–5.10)
RDW: 12.4 % (ref 11.0–15.0)
Total Lymphocyte: 36.9 %
WBC: 14.3 10*3/uL — ABNORMAL HIGH (ref 3.8–10.8)

## 2019-10-04 ENCOUNTER — Other Ambulatory Visit: Payer: Self-pay | Admitting: Family Medicine

## 2019-10-09 ENCOUNTER — Other Ambulatory Visit: Payer: Self-pay | Admitting: Family Medicine

## 2019-10-09 DIAGNOSIS — D72829 Elevated white blood cell count, unspecified: Secondary | ICD-10-CM

## 2019-10-15 ENCOUNTER — Telehealth: Payer: Self-pay | Admitting: Family Medicine

## 2019-10-15 NOTE — Telephone Encounter (Signed)
The patient called to report her father has recently been referred to an oncologist due to high markers for possible leukemia.  She thought due to her recent lab results PCP may need to know this.

## 2019-10-16 ENCOUNTER — Ambulatory Visit: Payer: Self-pay

## 2019-10-16 ENCOUNTER — Other Ambulatory Visit: Payer: Self-pay

## 2019-10-16 ENCOUNTER — Ambulatory Visit (HOSPITAL_BASED_OUTPATIENT_CLINIC_OR_DEPARTMENT_OTHER)
Admission: RE | Admit: 2019-10-16 | Discharge: 2019-10-16 | Disposition: A | Discharge status: Home / Self Care | Payer: BC Managed Care – PPO | Source: Ambulatory Visit | Attending: Family Medicine | Admitting: Family Medicine

## 2019-10-16 ENCOUNTER — Ambulatory Visit (INDEPENDENT_AMBULATORY_CARE_PROVIDER_SITE_OTHER): Payer: BC Managed Care – PPO | Admitting: Family Medicine

## 2019-10-16 ENCOUNTER — Encounter: Payer: Self-pay | Admitting: Family Medicine

## 2019-10-16 VITALS — BP 166/98 | Temp 94.0°F | Ht 65.0 in | Wt 165.0 lb

## 2019-10-16 DIAGNOSIS — M25562 Pain in left knee: Secondary | ICD-10-CM

## 2019-10-16 DIAGNOSIS — M1712 Unilateral primary osteoarthritis, left knee: Secondary | ICD-10-CM | POA: Diagnosis not present

## 2019-10-16 DIAGNOSIS — M25862 Other specified joint disorders, left knee: Secondary | ICD-10-CM | POA: Insufficient documentation

## 2019-10-16 DIAGNOSIS — M2342 Loose body in knee, left knee: Secondary | ICD-10-CM | POA: Diagnosis not present

## 2019-10-16 NOTE — Patient Instructions (Signed)
Nice to meet you We will call with the xray results today   Please send me a message in MyChart with any questions or updates.  We will schedule a virtual visit once the MRI is resulted.   --Dr. Jordan Likes

## 2019-10-16 NOTE — Progress Notes (Signed)
Brooke Medina - 49 y.o. female MRN [id number]  Date of birth: 07-17-1970  SUBJECTIVE:  Including CC & ROS.  Chief Complaint  Patient presents with  . Knee Pain    left    Brooke Medina is a 49 y.o. female that is presenting with left knee pain.  She has a history of 2 surgeries of the left knee.  She has limitation in her range of motion.  Denies any specific inciting event.  She has noticed a bulging on the medial aspect of the joint.  Denies any redness.   Review of Systems See HPI   HISTORY: Past Medical, Surgical, Social, and Family History Reviewed & Updated per EMR.   Pertinent Historical Findings include:  Past Medical History:  Diagnosis Date  . Diabetes mellitus type 2 in obese (HCC) 02/17/2017  . Hypertension     Past Surgical History:  Procedure Laterality Date  . TONSILLECTOMY AND ADENOIDECTOMY      Family History  Problem Relation Age of Onset  . Diabetes Mother   . Hyperlipidemia Mother   . Hypertension Mother   . Kidney disease Father   . Diabetes Father   . Hypertension Father   . Heart murmur Father   . Diabetes Maternal Grandmother   . Diabetes Maternal Grandfather   . Hypertension Paternal Grandmother   . Heart disease Paternal Grandmother   . Diabetes Paternal Grandfather   . Hypertension Paternal Grandfather     Social History   Socioeconomic History  . Marital status: Married    Spouse name: Not on file  . Number of children: Not on file  . Years of education: Not on file  . Highest education level: Not on file  Occupational History  . Not on file  Tobacco Use  . Smoking status: Current Every Day Smoker    Packs/day: 0.50  . Smokeless tobacco: Never Used  Vaping Use  . Vaping Use: Never used  Substance and Sexual Activity  . Alcohol use: Yes  . Drug use: No  . Sexual activity: Yes    Partners: Female, Female  Other Topics Concern  . Not on file  Social History Narrative  . Not on file   Social Determinants of Health    Financial Resource Strain:   . Difficulty of Paying Living Expenses:   Food Insecurity:   . Worried About Programme researcher, broadcasting/film/video in the Last Year:   . Barista in the Last Year:   Transportation Needs:   . Freight forwarder (Medical):   Marland Kitchen Lack of Transportation (Non-Medical):   Physical Activity:   . Days of Exercise per Week:   . Minutes of Exercise per Session:   Stress:   . Feeling of Stress :   Social Connections:   . Frequency of Communication with Friends and Family:   . Frequency of Social Gatherings with Friends and Family:   . Attends Religious Services:   . Active Member of Clubs or Organizations:   . Attends Banker Meetings:   Marland Kitchen Marital Status:   Intimate Partner Violence:   . Fear of Current or Ex-Partner:   . Emotionally Abused:   Marland Kitchen Physically Abused:   . Sexually Abused:      PHYSICAL EXAM:  VS: BP (!) 166/98   Temp (!) 94 F (34.4 C)   Ht 5\' 5"  (1.651 m)   Wt 165 lb (74.8 kg)   BMI 27.46 kg/m  Physical Exam Gen: NAD, alert, cooperative with  exam, well-appearing MSK:  Left knee: No obvious effusion. Obvious deformity over the left medial joint. Limited flexion extension. No instability. Neurovascularly intact  Limited ultrasound: Left knee:  No obvious effusion within the suprapatellar pouch but there is a calcification of this area. Normal-appearing quadricep and patellar tendon. There are anechoic entities deep to the patellar tendon.  There is some hyperemia associated with this area.  The anechoic area extends superficially to represent a protrusion observed on clinical exam.  Summary: Calcification within the suprapatellar pouch.  There are multiple cystic formations deep to the patellar tendon as well as extending superficially.  Ultrasound and interpretation by Clare Gandy, MD    ASSESSMENT & PLAN:   Knee joint cyst, left She has a history of 2 knee surgeries with recent changes of the knee itself.  She  has a leukocytosis with recent blood work.  Findings on ultrasound represent a likely cyst but worry for pigmented villonodular synovitis. -Counseled on supportive care. -X-ray. -MRI to evaluate for treatment and villonodular synovitis and history of previous knee surgeries.

## 2019-10-16 NOTE — Assessment & Plan Note (Signed)
She has a history of 2 knee surgeries with recent changes of the knee itself.  She has a leukocytosis with recent blood work.  Findings on ultrasound represent a likely cyst but worry for pigmented villonodular synovitis. -Counseled on supportive care. -X-ray. -MRI to evaluate for treatment and villonodular synovitis and history of previous knee surgeries.

## 2019-10-17 ENCOUNTER — Other Ambulatory Visit: Payer: Self-pay | Admitting: Family Medicine

## 2019-10-17 DIAGNOSIS — Z77018 Contact with and (suspected) exposure to other hazardous metals: Secondary | ICD-10-CM

## 2019-10-18 ENCOUNTER — Telehealth: Payer: Self-pay | Admitting: Family Medicine

## 2019-10-18 NOTE — Telephone Encounter (Signed)
Informed of results.   Myra Rude, MD [facility] 10/18/2019, 11:57 AM

## 2019-10-19 ENCOUNTER — Encounter: Payer: Self-pay | Admitting: Family Medicine

## 2019-10-19 ENCOUNTER — Ambulatory Visit (INDEPENDENT_AMBULATORY_CARE_PROVIDER_SITE_OTHER): Payer: BC Managed Care – PPO

## 2019-10-19 ENCOUNTER — Other Ambulatory Visit: Payer: Self-pay

## 2019-10-19 DIAGNOSIS — M25862 Other specified joint disorders, left knee: Secondary | ICD-10-CM

## 2019-10-19 DIAGNOSIS — M25562 Pain in left knee: Secondary | ICD-10-CM | POA: Diagnosis not present

## 2019-10-19 DIAGNOSIS — Z01818 Encounter for other preprocedural examination: Secondary | ICD-10-CM | POA: Diagnosis not present

## 2019-10-19 DIAGNOSIS — Z77018 Contact with and (suspected) exposure to other hazardous metals: Secondary | ICD-10-CM

## 2019-10-24 ENCOUNTER — Other Ambulatory Visit (INDEPENDENT_AMBULATORY_CARE_PROVIDER_SITE_OTHER): Payer: BC Managed Care – PPO

## 2019-10-24 ENCOUNTER — Other Ambulatory Visit: Payer: Self-pay

## 2019-10-24 ENCOUNTER — Telehealth (INDEPENDENT_AMBULATORY_CARE_PROVIDER_SITE_OTHER): Payer: BC Managed Care – PPO | Admitting: Family Medicine

## 2019-10-24 DIAGNOSIS — M25862 Other specified joint disorders, left knee: Secondary | ICD-10-CM

## 2019-10-24 DIAGNOSIS — D72829 Elevated white blood cell count, unspecified: Secondary | ICD-10-CM | POA: Diagnosis not present

## 2019-10-24 NOTE — Progress Notes (Signed)
Virtual Visit via Video Note  I connected with Brooke Medina on 10/24/19 at  8:10 AM EDT by a video enabled telemedicine application and verified that I am speaking with the correct person using two identifiers.   I discussed the limitations of evaluation and management by telemedicine and the availability of in person appointments. The patient expressed understanding and agreed to proceed.  Patient: home  Physician: office  History of Present Illness:  Brooke Medina is a 49 year old female that is following up after the MRI of her left knee.  It was demonstrated a multiloculated ganglion cyst as well as an intra-articular loose body and tricompartmental arthritis.  Her pain has been manageable and denies any mechanical symptoms.   Observations/Objective:  Gen: NAD, alert, cooperative with exam, well-appearing  Assessment and Plan:  Knee joint cyst:  MRI was reassuring as it was confirmed to be a ganglion cyst.  She does have a loose body but no mechanical symptoms. -Counseled on home exercise therapy and supportive care. -Could consider injection or physical therapy. -Counseled on compression.  Follow Up Instructions:    I discussed the assessment and treatment plan with the patient. The patient was provided an opportunity to ask questions and all were answered. The patient agreed with the plan and demonstrated an understanding of the instructions.   The patient was advised to call back or seek an in-person evaluation if the symptoms worsen or if the condition fails to improve as anticipated.   Clare Gandy, MD

## 2019-10-24 NOTE — Assessment & Plan Note (Signed)
MRI was reassuring as it was confirmed to be a ganglion cyst.  She does have a loose body but no mechanical symptoms. -Counseled on home exercise therapy and supportive care. -Could consider injection or physical therapy. -Counseled on compression.

## 2019-10-29 LAB — LEUKEMIA/LYMPHOMA EVALUATION PANEL
Clinical Information:: 48800637
NUMBER OF MARKERS:: 22
VIABILITY:: 60 %

## 2019-11-01 ENCOUNTER — Encounter: Payer: Self-pay | Admitting: Family Medicine

## 2019-11-01 ENCOUNTER — Other Ambulatory Visit: Payer: Self-pay

## 2019-11-01 ENCOUNTER — Ambulatory Visit: Payer: BC Managed Care – PPO | Admitting: Family Medicine

## 2019-11-01 VITALS — BP 122/80 | HR 100 | Temp 98.7°F | Ht 65.0 in | Wt 168.0 lb

## 2019-11-01 DIAGNOSIS — D72829 Elevated white blood cell count, unspecified: Secondary | ICD-10-CM | POA: Diagnosis not present

## 2019-11-01 DIAGNOSIS — E559 Vitamin D deficiency, unspecified: Secondary | ICD-10-CM | POA: Diagnosis not present

## 2019-11-01 LAB — VITAMIN D 25 HYDROXY (VIT D DEFICIENCY, FRACTURES): Vit D, 25-Hydroxy: 51 ng/mL (ref 30–100)

## 2019-11-01 NOTE — Progress Notes (Signed)
Chief Complaint  Patient presents with  . Follow-up    Subjective: Patient is a 49 y.o. female here for f/u labs.  Had elevated WBC, which is not new, but some worrisome findings on path. Follow up testing found no concern for malignancy. Her dad has CLL that is being monitored. She denies fevers, wt loss, or night sweats.    Past Medical History:  Diagnosis Date  . Diabetes mellitus type 2 in obese (HCC) 02/17/2017  . Hypertension     Objective: BP 122/80 (BP Location: Left Arm, Patient Position: Sitting, Cuff Size: Normal)   Pulse 100   Temp 98.7 F (37.1 C) (Oral)   Ht 5\' 5"  (1.651 m)   Wt 168 lb (76.2 kg)   SpO2 96%   BMI 27.96 kg/m  General: Awake, appears stated age Lungs: No accessory muscle use Psych: Age appropriate judgment and insight, normal affect and mood  Assessment and Plan: Leukocytosis, unspecified type  Vitamin D deficiency - Plan: VITAMIN D 25 Hydroxy (Vit-D Deficiency, Fractures)  1. Reassurance. Will monitor q 6 mo for a few years and then yearly if stable.  2. Ck above. She has done well w supp.  F/u in 3 mo for med ck and then q 6 mo after that if doing well.  The patient voiced understanding and agreement to the plan.  [place], DO 11/01/19  2:39 PM

## 2019-11-01 NOTE — Patient Instructions (Signed)
Give [place] 2-3 business days to get the results of your labs back.   We will monitor your blood count, but there is no evidence of anything sinister on the follow up lab value.   Let us know if you need anything.

## 2019-11-04 ENCOUNTER — Other Ambulatory Visit: Payer: Self-pay | Admitting: Family Medicine

## 2019-11-04 MED ORDER — FREESTYLE LIBRE 2 SENSOR MISC: 1.0000 | 1 refills | Status: DC

## 2019-11-04 MED ORDER — FREESTYLE LIBRE 2 READER DEVI: 1.0000 | 0 refills | Status: AC

## 2019-11-12 DIAGNOSIS — Z3042 Encounter for surveillance of injectable contraceptive: Secondary | ICD-10-CM | POA: Diagnosis not present

## 2019-11-18 ENCOUNTER — Other Ambulatory Visit: Payer: BC Managed Care – PPO

## 2019-12-05 ENCOUNTER — Other Ambulatory Visit: Payer: Self-pay | Admitting: Family Medicine

## 2020-01-14 ENCOUNTER — Other Ambulatory Visit: Payer: Self-pay

## 2020-01-14 ENCOUNTER — Telehealth (INDEPENDENT_AMBULATORY_CARE_PROVIDER_SITE_OTHER): Payer: BC Managed Care – PPO | Admitting: Family Medicine

## 2020-01-14 ENCOUNTER — Encounter: Payer: Self-pay | Admitting: Family Medicine

## 2020-01-14 DIAGNOSIS — M545 Low back pain, unspecified: Secondary | ICD-10-CM

## 2020-01-14 DIAGNOSIS — K219 Gastro-esophageal reflux disease without esophagitis: Secondary | ICD-10-CM | POA: Diagnosis not present

## 2020-01-14 MED ORDER — MELOXICAM 15 MG PO TABS: 15.0000 mg | ORAL_TABLET | Freq: Every day | ORAL | 0 refills | Status: DC

## 2020-01-14 MED ORDER — PANTOPRAZOLE SODIUM 40 MG PO TBEC: 40.0000 mg | DELAYED_RELEASE_TABLET | Freq: Every day | ORAL | 3 refills | Status: DC

## 2020-01-14 NOTE — Progress Notes (Signed)
Chief Complaint  Patient presents with  . Nausea    one month.  Acid reflux    Subjective: Patient is a 49 y.o. female here for reflux. Due to COVID-19 pandemic, we are interacting via web portal for an electronic face-to-face visit. I verified patient's ID using 2 identifiers. Patient agreed to proceed with visit via this method. Patient is at home, I am at office. Patient and I are present for visit.   1 mo of N/V, reflux, heart burn, waterbrash. Took Tums that did help. Certain foods flare symptoms. No bleeding, wt loss, nighttime awakenings, fevers.   Pt w 1 d hx of R lower back pain. No inj. Did take down Halloween decorations over weekend, but nothing heavy. No redness, bruising, swelling, neurologic s/s's. Urinating normally without blood. +hx of kidney stones 10+ years ago, does not feel like that.   Past Medical History:  Diagnosis Date  . Diabetes mellitus type 2 in obese (HCC) 02/17/2017  . Hypertension     Objective: No conversational dyspnea Age appropriate judgment and insight Nml affect and mood  Assessment and Plan: Gastroesophageal reflux disease, unspecified whether esophagitis present - Plan: pantoprazole (PROTONIX) 40 MG tablet  Acute right-sided low back pain without sciatica - Plan: meloxicam (MOBIC) 15 MG tablet  1. PPI for 60 d. F/u after that. Reflux precautions discussed. 2. Mobic trial. Heat, ice.  If no better, I want to see her in person in 2-3 weeks.  The patient voiced understanding and agreement to the plan.  Jilda Roche [place], DO 01/14/20  10:33 AM

## 2020-01-30 ENCOUNTER — Telehealth: Payer: Self-pay | Admitting: Family Medicine

## 2020-01-30 NOTE — Telephone Encounter (Signed)
Caller: Sindy  Call Back # [phone]  Patient called requesting a 90 day supply of :  Continuous Blood Gluc Sensor (FREESTYLE LIBRE 2 SENSOR) MISC  Patient states she can get this at a cheaper price if we send in 90day supply.  Patient will use [facility]    Please Advise

## 2020-02-03 DIAGNOSIS — Z3042 Encounter for surveillance of injectable contraceptive: Secondary | ICD-10-CM | POA: Diagnosis not present

## 2020-02-03 MED ORDER — FREESTYLE LIBRE 2 SENSOR MISC: 1.0000 | 1 refills | Status: DC

## 2020-02-03 NOTE — Addendum Note (Signed)
Addended by: Scharlene Gloss B on: 02/03/2020 07:02 AM   Modules accepted: Orders

## 2020-02-03 NOTE — Telephone Encounter (Signed)
Sent in

## 2020-02-05 DIAGNOSIS — F411 Generalized anxiety disorder: Secondary | ICD-10-CM | POA: Diagnosis not present

## 2020-02-05 DIAGNOSIS — Z3042 Encounter for surveillance of injectable contraceptive: Secondary | ICD-10-CM | POA: Diagnosis not present

## 2020-02-05 DIAGNOSIS — Z01419 Encounter for gynecological examination (general) (routine) without abnormal findings: Secondary | ICD-10-CM | POA: Diagnosis not present

## 2020-03-06 ENCOUNTER — Other Ambulatory Visit: Payer: Self-pay | Admitting: Family Medicine

## 2020-03-27 DIAGNOSIS — N6489 Other specified disorders of breast: Secondary | ICD-10-CM | POA: Diagnosis not present

## 2020-03-27 DIAGNOSIS — R928 Other abnormal and inconclusive findings on diagnostic imaging of breast: Secondary | ICD-10-CM | POA: Diagnosis not present

## 2020-04-24 DIAGNOSIS — Z3042 Encounter for surveillance of injectable contraceptive: Secondary | ICD-10-CM | POA: Diagnosis not present

## 2020-06-19 ENCOUNTER — Encounter: Payer: Self-pay | Admitting: Family Medicine

## 2020-06-19 ENCOUNTER — Ambulatory Visit: Payer: BC Managed Care – PPO | Admitting: Family Medicine

## 2020-06-19 ENCOUNTER — Other Ambulatory Visit: Payer: Self-pay

## 2020-06-19 VITALS — BP 114/75 | HR 86 | Temp 98.1°F | Resp 18 | Ht 65.0 in | Wt 167.0 lb

## 2020-06-19 DIAGNOSIS — K219 Gastro-esophageal reflux disease without esophagitis: Secondary | ICD-10-CM

## 2020-06-19 DIAGNOSIS — D72829 Elevated white blood cell count, unspecified: Secondary | ICD-10-CM | POA: Diagnosis not present

## 2020-06-19 DIAGNOSIS — I10 Essential (primary) hypertension: Secondary | ICD-10-CM

## 2020-06-19 DIAGNOSIS — E669 Obesity, unspecified: Secondary | ICD-10-CM

## 2020-06-19 DIAGNOSIS — E1169 Type 2 diabetes mellitus with other specified complication: Secondary | ICD-10-CM | POA: Diagnosis not present

## 2020-06-19 MED ORDER — FREESTYLE LIBRE 2 SENSOR MISC: 1.0000 | 1 refills | Status: AC

## 2020-06-19 MED ORDER — OZEMPIC (0.25 OR 0.5 MG/DOSE) 2 MG/1.5ML ~~LOC~~ SOPN: PEN_INJECTOR | SUBCUTANEOUS | 1 refills | Status: AC

## 2020-06-19 MED ORDER — ATENOLOL-CHLORTHALIDONE 50-25 MG PO TABS: 1.0000 | ORAL_TABLET | Freq: Every day | ORAL | 7 refills | Status: DC

## 2020-06-19 MED ORDER — ATORVASTATIN CALCIUM 40 MG PO TABS: 40.0000 mg | ORAL_TABLET | Freq: Every day | ORAL | 6 refills | Status: DC

## 2020-06-19 MED ORDER — PANTOPRAZOLE SODIUM 40 MG PO TBEC: 40.0000 mg | DELAYED_RELEASE_TABLET | Freq: Every day | ORAL | 3 refills | Status: DC

## 2020-06-19 MED ORDER — OLMESARTAN MEDOXOMIL 20 MG PO TABS: 20.0000 mg | ORAL_TABLET | Freq: Every day | ORAL | 3 refills | Status: DC

## 2020-06-19 MED ORDER — METFORMIN HCL 500 MG PO TABS: ORAL_TABLET | ORAL | 5 refills | Status: DC

## 2020-06-19 NOTE — Progress Notes (Addendum)
Subjective:   Chief Complaint  Patient presents with  . Medication Refill    Brooke Medina is a 50 y.o. female here for follow-up of diabetes.   [name]'s self monitored glucose range is mid-high 100's.  Patient denies hypoglycemic reactions. She has CGM.  Patient does not require insulin.   Medications include: metformin 1000 mg bid Diet is overall healthy.  Exercise: some walking No SOB or CP.   The patient has a history of elevated blood pressure for which she takes atenolol-chlorthalidone daily.  She thinks it is working okay but not as well as it used to before the colors changed on her.  Past Medical History:  Diagnosis Date  . Diabetes mellitus type 2 in obese (HCC) 02/17/2017  . Hypertension      Related testing: Retinal exam: Due Pneumovax: done  Objective:  BP 114/75   Pulse 86   Temp 98.1 F (36.7 C) (Oral)   Resp 18   Ht 5\' 5"  (1.651 m)   Wt 167 lb (75.8 kg)   SpO2 99%   BMI 27.79 kg/m  General:  Well developed, well nourished, in no apparent distress Skin:  Warm, no pallor or diaphoresis Lungs:  CTAB, no access msc use Cardio:  RRR, no bruits, no LE edema Psych: Age appropriate judgment and insight  Assessment:   Diabetes mellitus type 2 in obese (HCC) - Plan: atorvastatin (LIPITOR) 40 MG tablet, Hemoglobin A1c, Comprehensive metabolic panel, Lipid panel, Semaglutide,0.25 or 0.5MG /DOS, (OZEMPIC, 0.25 OR 0.5 MG/DOSE,) 2 MG/1.5ML SOPN  Gastroesophageal reflux disease, unspecified whether esophagitis present - Plan: pantoprazole (PROTONIX) 40 MG tablet  Leukocytosis, unspecified type - Plan: CBC w/Diff  Essential hypertension - Plan: olmesartan (BENICAR) 20 MG tablet   Plan:   1.  Continue Metformin 1000 mg twice daily, add Ozempic weekly if A1c is not controlled.  She will come back for labs.  Continue monitoring sugars at home.  She will have her eye exam coming up and will have them send her notes here.  Counseled on diet and exercise. 2.   Continue Protonix as needed. 3.  Continue to monitor. 4.  Benicar 20 mg daily in place of atenolol-chlorthalidone combination.  This will offer more renal protection. F/u in 6 weeks. The patient voiced understanding and agreement to the plan.  [place], DO 06/19/20 4:51 PM

## 2020-06-19 NOTE — Patient Instructions (Addendum)
Keep the diet clean and stay active.  Give [place] 2-3 business days to get the results of your labs back.   Let me know if there are any cost issues.  Please consider counseling. Contact [phone] to schedule an appointment or inquire about cost/insurance coverage.  Let us know if you need anything.

## 2020-06-26 ENCOUNTER — Other Ambulatory Visit (INDEPENDENT_AMBULATORY_CARE_PROVIDER_SITE_OTHER): Payer: BC Managed Care – PPO

## 2020-06-26 ENCOUNTER — Other Ambulatory Visit: Payer: Self-pay

## 2020-06-26 DIAGNOSIS — E1169 Type 2 diabetes mellitus with other specified complication: Secondary | ICD-10-CM | POA: Diagnosis not present

## 2020-06-26 DIAGNOSIS — D72829 Elevated white blood cell count, unspecified: Secondary | ICD-10-CM

## 2020-06-26 DIAGNOSIS — E669 Obesity, unspecified: Secondary | ICD-10-CM

## 2020-06-26 NOTE — Addendum Note (Signed)
Addended by: Thelma Barge D on: 06/26/2020 02:17 PM   Modules accepted: Orders

## 2020-06-26 NOTE — Addendum Note (Signed)
Addended by: Thelma Barge D on: 06/26/2020 02:18 PM   Modules accepted: Orders

## 2020-06-27 LAB — HEMOGLOBIN A1C
Hgb A1c MFr Bld: 7.5 % of total Hgb — ABNORMAL HIGH (ref <5.7)
Mean Plasma Glucose: 169 mg/dL
eAG (mmol/L): 9.3 mmol/L

## 2020-06-27 LAB — COMPREHENSIVE METABOLIC PANEL
AG Ratio: 2.3 (calc) (ref 1.0–2.5)
ALT: 29 U/L (ref 6–29)
AST: 17 U/L (ref 10–35)
Albumin: 4.5 g/dL (ref 3.6–5.1)
Alkaline phosphatase (APISO): 46 U/L (ref 31–125)
BUN: 20 mg/dL (ref 7–25)
CO2: 25 mmol/L (ref 20–32)
Calcium: 9.4 mg/dL (ref 8.6–10.2)
Chloride: 106 mmol/L (ref 98–110)
Creat: 0.72 mg/dL (ref 0.50–1.10)
Globulin: 2 g/dL (calc) (ref 1.9–3.7)
Glucose, Bld: 148 mg/dL — ABNORMAL HIGH (ref 65–99)
Potassium: 3.8 mmol/L (ref 3.5–5.3)
Sodium: 140 mmol/L (ref 135–146)
Total Bilirubin: 0.4 mg/dL (ref 0.2–1.2)
Total Protein: 6.5 g/dL (ref 6.1–8.1)

## 2020-06-27 LAB — CBC WITH DIFFERENTIAL/PLATELET
Absolute Monocytes: 968 cells/uL — ABNORMAL HIGH (ref 200–950)
Basophils Absolute: 65 cells/uL (ref 0–200)
Basophils Relative: 0.5 %
Eosinophils Absolute: 90 cells/uL (ref 15–500)
Eosinophils Relative: 0.7 %
HCT: 41.1 % (ref 35.0–45.0)
Hemoglobin: 14 g/dL (ref 11.7–15.5)
Lymphs Abs: 4476 cells/uL — ABNORMAL HIGH (ref 850–3900)
MCH: 30.4 pg (ref 27.0–33.0)
MCHC: 34.1 g/dL (ref 32.0–36.0)
MCV: 89.2 fL (ref 80.0–100.0)
MPV: 9.6 fL (ref 7.5–12.5)
Monocytes Relative: 7.5 %
Neutro Abs: 7301 cells/uL (ref 1500–7800)
Neutrophils Relative %: 56.6 %
Platelets: 337 10*3/uL (ref 140–400)
RBC: 4.61 10*6/uL (ref 3.80–5.10)
RDW: 12.6 % (ref 11.0–15.0)
Total Lymphocyte: 34.7 %
WBC: 12.9 10*3/uL — ABNORMAL HIGH (ref 3.8–10.8)

## 2020-06-27 LAB — LIPID PANEL
Cholesterol: 110 mg/dL (ref <200)
HDL: 45 mg/dL — ABNORMAL LOW (ref >=50)
LDL Cholesterol (Calc): 36 mg/dL (calc)
Non-HDL Cholesterol (Calc): 65 mg/dL (calc) (ref <130)
Total CHOL/HDL Ratio: 2.4 (calc) (ref <5.0)
Triglycerides: 231 mg/dL — ABNORMAL HIGH (ref <150)

## 2020-06-28 ENCOUNTER — Other Ambulatory Visit: Payer: Self-pay | Admitting: Family Medicine

## 2020-06-28 DIAGNOSIS — D72829 Elevated white blood cell count, unspecified: Secondary | ICD-10-CM

## 2020-06-29 ENCOUNTER — Other Ambulatory Visit: Payer: Self-pay | Admitting: Family Medicine

## 2020-06-29 DIAGNOSIS — D72829 Elevated white blood cell count, unspecified: Secondary | ICD-10-CM

## 2020-06-29 MED ORDER — METOPROLOL SUCCINATE ER 50 MG PO TB24
50.0000 mg | ORAL_TABLET | Freq: Every day | ORAL | 3 refills | Status: DC
Start: 2020-06-29 — End: 2021-07-27

## 2020-07-16 DIAGNOSIS — Z3042 Encounter for surveillance of injectable contraceptive: Secondary | ICD-10-CM | POA: Diagnosis not present

## 2020-08-05 ENCOUNTER — Ambulatory Visit: Payer: BC Managed Care – PPO | Admitting: Family Medicine

## 2020-08-18 ENCOUNTER — Telehealth: Payer: Self-pay | Admitting: Family Medicine

## 2020-08-18 ENCOUNTER — Other Ambulatory Visit: Payer: Self-pay | Admitting: Family Medicine

## 2020-08-18 DIAGNOSIS — D72829 Elevated white blood cell count, unspecified: Secondary | ICD-10-CM

## 2020-08-18 NOTE — Telephone Encounter (Signed)
Put in referral for oncology to  [facility].  Patient is aware

## 2020-08-18 NOTE — Telephone Encounter (Signed)
Patient states [facility] has not received referral or doctors notes

## 2020-08-25 ENCOUNTER — Telehealth: Payer: Self-pay

## 2020-08-25 NOTE — Telephone Encounter (Signed)
Pt called- she is needing referral for oncology resent to [facility] in [facility]- they have not received referral information that was placed on 08/18/20. She requests that we call them once referral is sent again to make sure they get it as they are having issues with their fax machine.

## 2020-08-27 DIAGNOSIS — E119 Type 2 diabetes mellitus without complications: Secondary | ICD-10-CM | POA: Diagnosis not present

## 2020-08-27 DIAGNOSIS — H35033 Hypertensive retinopathy, bilateral: Secondary | ICD-10-CM | POA: Diagnosis not present

## 2020-08-27 DIAGNOSIS — H35412 Lattice degeneration of retina, left eye: Secondary | ICD-10-CM | POA: Diagnosis not present

## 2020-08-27 DIAGNOSIS — H10413 Chronic giant papillary conjunctivitis, bilateral: Secondary | ICD-10-CM | POA: Diagnosis not present

## 2020-09-30 DIAGNOSIS — Z3042 Encounter for surveillance of injectable contraceptive: Secondary | ICD-10-CM | POA: Diagnosis not present

## 2020-11-28 ENCOUNTER — Other Ambulatory Visit: Payer: Self-pay | Admitting: Family Medicine

## 2020-11-28 DIAGNOSIS — I10 Essential (primary) hypertension: Secondary | ICD-10-CM

## 2020-12-01 DIAGNOSIS — Z7984 Long term (current) use of oral hypoglycemic drugs: Secondary | ICD-10-CM | POA: Diagnosis not present

## 2020-12-01 DIAGNOSIS — D72829 Elevated white blood cell count, unspecified: Secondary | ICD-10-CM | POA: Diagnosis not present

## 2020-12-01 DIAGNOSIS — I10 Essential (primary) hypertension: Secondary | ICD-10-CM | POA: Diagnosis not present

## 2020-12-01 DIAGNOSIS — F17210 Nicotine dependence, cigarettes, uncomplicated: Secondary | ICD-10-CM | POA: Diagnosis not present

## 2020-12-01 DIAGNOSIS — Z806 Family history of leukemia: Secondary | ICD-10-CM | POA: Diagnosis not present

## 2020-12-01 DIAGNOSIS — D729 Disorder of white blood cells, unspecified: Secondary | ICD-10-CM | POA: Diagnosis not present

## 2020-12-01 DIAGNOSIS — E119 Type 2 diabetes mellitus without complications: Secondary | ICD-10-CM | POA: Diagnosis not present

## 2020-12-18 DIAGNOSIS — Z3042 Encounter for surveillance of injectable contraceptive: Secondary | ICD-10-CM | POA: Diagnosis not present

## 2021-02-09 DIAGNOSIS — Z01411 Encounter for gynecological examination (general) (routine) with abnormal findings: Secondary | ICD-10-CM | POA: Diagnosis not present

## 2021-02-09 DIAGNOSIS — F411 Generalized anxiety disorder: Secondary | ICD-10-CM | POA: Diagnosis not present

## 2021-02-14 DIAGNOSIS — S50811A Abrasion of right forearm, initial encounter: Secondary | ICD-10-CM | POA: Diagnosis not present

## 2021-02-14 DIAGNOSIS — W5501XA Bitten by cat, initial encounter: Secondary | ICD-10-CM | POA: Diagnosis not present

## 2021-02-14 DIAGNOSIS — Y999 Unspecified external cause status: Secondary | ICD-10-CM | POA: Diagnosis not present

## 2021-02-14 DIAGNOSIS — Z23 Encounter for immunization: Secondary | ICD-10-CM | POA: Diagnosis not present

## 2021-02-15 DIAGNOSIS — M2342 Loose body in knee, left knee: Secondary | ICD-10-CM | POA: Diagnosis not present

## 2021-02-15 DIAGNOSIS — M1712 Unilateral primary osteoarthritis, left knee: Secondary | ICD-10-CM | POA: Diagnosis not present

## 2021-02-17 DIAGNOSIS — Z203 Contact with and (suspected) exposure to rabies: Secondary | ICD-10-CM | POA: Diagnosis not present

## 2021-02-17 DIAGNOSIS — Y999 Unspecified external cause status: Secondary | ICD-10-CM | POA: Diagnosis not present

## 2021-02-17 DIAGNOSIS — W5501XD Bitten by cat, subsequent encounter: Secondary | ICD-10-CM | POA: Diagnosis not present

## 2021-02-17 DIAGNOSIS — S50811D Abrasion of right forearm, subsequent encounter: Secondary | ICD-10-CM | POA: Diagnosis not present

## 2021-02-21 DIAGNOSIS — Z203 Contact with and (suspected) exposure to rabies: Secondary | ICD-10-CM | POA: Diagnosis not present

## 2021-02-21 DIAGNOSIS — S51811D Laceration without foreign body of right forearm, subsequent encounter: Secondary | ICD-10-CM | POA: Diagnosis not present

## 2021-02-21 DIAGNOSIS — W5501XD Bitten by cat, subsequent encounter: Secondary | ICD-10-CM | POA: Diagnosis not present

## 2021-02-21 DIAGNOSIS — Y999 Unspecified external cause status: Secondary | ICD-10-CM | POA: Diagnosis not present

## 2021-02-21 DIAGNOSIS — S50811D Abrasion of right forearm, subsequent encounter: Secondary | ICD-10-CM | POA: Diagnosis not present

## 2021-02-28 DIAGNOSIS — W5501XD Bitten by cat, subsequent encounter: Secondary | ICD-10-CM | POA: Diagnosis not present

## 2021-02-28 DIAGNOSIS — Z203 Contact with and (suspected) exposure to rabies: Secondary | ICD-10-CM | POA: Diagnosis not present

## 2021-02-28 DIAGNOSIS — S50811D Abrasion of right forearm, subsequent encounter: Secondary | ICD-10-CM | POA: Diagnosis not present

## 2021-02-28 DIAGNOSIS — Y999 Unspecified external cause status: Secondary | ICD-10-CM | POA: Diagnosis not present

## 2021-03-05 DIAGNOSIS — Z3042 Encounter for surveillance of injectable contraceptive: Secondary | ICD-10-CM | POA: Diagnosis not present

## 2021-03-05 DIAGNOSIS — Z23 Encounter for immunization: Secondary | ICD-10-CM | POA: Diagnosis not present

## 2021-04-24 ENCOUNTER — Other Ambulatory Visit: Payer: Self-pay | Admitting: Family Medicine

## 2021-05-21 DIAGNOSIS — Z3042 Encounter for surveillance of injectable contraceptive: Secondary | ICD-10-CM | POA: Diagnosis not present

## 2021-06-03 DIAGNOSIS — D72829 Elevated white blood cell count, unspecified: Secondary | ICD-10-CM | POA: Diagnosis not present

## 2021-06-03 DIAGNOSIS — D72821 Monocytosis (symptomatic): Secondary | ICD-10-CM | POA: Diagnosis not present

## 2021-06-03 DIAGNOSIS — F17200 Nicotine dependence, unspecified, uncomplicated: Secondary | ICD-10-CM | POA: Diagnosis not present

## 2021-06-03 DIAGNOSIS — D729 Disorder of white blood cells, unspecified: Secondary | ICD-10-CM | POA: Diagnosis not present

## 2021-07-25 ENCOUNTER — Other Ambulatory Visit: Payer: Self-pay | Admitting: Family Medicine

## 2021-07-25 DIAGNOSIS — E1169 Type 2 diabetes mellitus with other specified complication: Secondary | ICD-10-CM

## 2021-07-26 ENCOUNTER — Other Ambulatory Visit: Payer: Self-pay | Admitting: Family Medicine

## 2021-07-30 ENCOUNTER — Telehealth: Payer: Self-pay | Admitting: Family Medicine

## 2021-07-30 DIAGNOSIS — I10 Essential (primary) hypertension: Secondary | ICD-10-CM

## 2021-07-30 MED ORDER — OLMESARTAN MEDOXOMIL 20 MG PO TABS: 20.0000 mg | ORAL_TABLET | Freq: Every day | ORAL | 0 refills | Status: DC

## 2021-07-30 NOTE — Telephone Encounter (Signed)
Called the patient left message to call back regarding medication refill. ? ?

## 2021-08-06 DIAGNOSIS — Z3042 Encounter for surveillance of injectable contraceptive: Secondary | ICD-10-CM | POA: Diagnosis not present

## 2021-08-26 ENCOUNTER — Other Ambulatory Visit: Payer: Self-pay | Admitting: Family Medicine

## 2021-08-26 DIAGNOSIS — I10 Essential (primary) hypertension: Secondary | ICD-10-CM

## 2021-08-30 ENCOUNTER — Encounter: Payer: Self-pay | Admitting: Family Medicine

## 2021-09-08 ENCOUNTER — Encounter: Payer: Self-pay | Admitting: Family Medicine

## 2021-09-08 ENCOUNTER — Ambulatory Visit (INDEPENDENT_AMBULATORY_CARE_PROVIDER_SITE_OTHER): Payer: BC Managed Care – PPO | Admitting: Family Medicine

## 2021-09-08 VITALS — BP 130/86 | HR 74 | Temp 98.0°F | Ht 65.0 in | Wt 169.4 lb

## 2021-09-08 DIAGNOSIS — Z1211 Encounter for screening for malignant neoplasm of colon: Secondary | ICD-10-CM | POA: Diagnosis not present

## 2021-09-08 DIAGNOSIS — E1165 Type 2 diabetes mellitus with hyperglycemia: Secondary | ICD-10-CM

## 2021-09-08 DIAGNOSIS — I10 Essential (primary) hypertension: Secondary | ICD-10-CM

## 2021-09-08 DIAGNOSIS — Z0000 Encounter for general adult medical examination without abnormal findings: Secondary | ICD-10-CM

## 2021-09-08 DIAGNOSIS — M7989 Other specified soft tissue disorders: Secondary | ICD-10-CM | POA: Diagnosis not present

## 2021-09-08 MED ORDER — METFORMIN HCL ER 500 MG PO TB24: 1000.0000 mg | ORAL_TABLET | Freq: Every day | ORAL | 5 refills | Status: DC

## 2021-09-08 MED ORDER — OLMESARTAN MEDOXOMIL 20 MG PO TABS: ORAL_TABLET | ORAL | 5 refills | Status: DC

## 2021-09-08 MED ORDER — METOPROLOL SUCCINATE ER 50 MG PO TB24: ORAL_TABLET | ORAL | 5 refills | Status: DC

## 2021-09-08 MED ORDER — METFORMIN HCL 500 MG PO TABS: 1000.0000 mg | ORAL_TABLET | Freq: Two times a day (BID) | ORAL | 5 refills | Status: DC

## 2021-09-08 NOTE — Patient Instructions (Addendum)
Give [place] 2-3 business days to get the results of your labs back.   Keep the diet clean and stay active.  If you do not hear anything about your referral in the next 1-2 weeks, call our office and ask for an update. You will receive another call for the CT scan of the neck.   Please get me a copy of your advanced directive form at your convenience.   Let us know if you need anything.

## 2021-09-08 NOTE — Progress Notes (Signed)
Chief Complaint  Patient presents with   Annual Exam     Well Woman Brooke Medina is here for a complete physical.   Her last physical was >1 year ago.  Current diet: in general, a "so-so" diet. Current exercise: lifting weights, yoga. Weight is stable and she denies fatigue out of ordinary.  Seatbelt? Most of the time Advanced directive? No  Health Maintenance Pap/HPV- Yes Mammogram- Yes Tetanus- Yes Hep C screening- Yes HIV screening- Yes  Skin lesion The patient has noticed a lump in the front of her left anterior neck for the past week.  It hurts when she pushes on it.  No new lotions, soaps, topicals, or detergents.  It does not itch or drain.  She is wondering if it is related to her thyroid.  She does have some difficulty losing weight despite efforts.  No other skin changes.  She is a current smoker.  Diabetes Patient has been lost to follow-up for the past 14 months.  She is compliant with her metformin 1000 mg daily/a.m. dose only.  She does not take an evening dosage.  She tried Ozempic for 4 doses and did not tolerate it well as it caused nausea.  She would prefer not to be on this type of medication.  She has not been monitoring her blood sugars at home.  Diet and exercise as above.  No feelings of hypoglycemia.  Past Medical History:  Diagnosis Date   Diabetes mellitus type 2 in obese ([place]) 02/17/2017   Hypertension      Past Surgical History:  Procedure Laterality Date   TONSILLECTOMY AND ADENOIDECTOMY      Medications  Current Outpatient Medications on File Prior to Visit  Medication Sig Dispense Refill   ALPRAZolam (XANAX) 0.5 MG tablet Take 0.5 mg at bedtime as needed by mouth for anxiety.     atorvastatin (LIPITOR) 40 MG tablet TAKE 1 TABLET(40 MG) BY MOUTH DAILY 30 tablet 6   Continuous Blood Gluc Receiver (FREESTYLE LIBRE 2 READER) DEVI 1 Device by Does not apply route as directed. 1 each 0   Continuous Blood Gluc Sensor (FREESTYLE LIBRE 2 SENSOR)  MISC 1 Device by Does not apply route as directed. 42 each 1   medroxyPROGESTERone (DEPO-PROVERA) 150 MG/ML injection Inject 150 mg into the muscle every 3 (three) months.     pantoprazole (PROTONIX) 40 MG tablet Take 1 tablet (40 mg total) by mouth daily. 30 tablet 3    Allergies Allergies  Allergen Reactions   Latex    Penicillins Rash   Sulfa Antibiotics Rash    Review of Systems: Constitutional:  no unexpected weight changes Eye:  no recent significant change in vision Ear/Nose/Mouth/Throat:  Ears:  no recent change in hearing Nose/Mouth/Throat:  no complaints of nasal congestion, no sore throat Cardiovascular: no chest pain Respiratory:  no shortness of breath Gastrointestinal:  no abdominal pain, no change in bowel habits GU:  Female: negative for dysuria or pelvic pain Musculoskeletal/Extremities:  no pain of the joints Integumentary (Skin/Breast):  + Mass on neck Neurologic:  no headaches Endocrine:  denies fatigue Hematologic/Lymphatic:  No areas of easy bleeding  Exam BP 130/86   Pulse 74   Temp 98 F (36.7 C) (Oral)   Ht 5\' 5"  (1.651 m)   Wt 169 lb 6 oz (76.8 kg)   SpO2 98%   BMI 28.19 kg/m  General:  well developed, well nourished, in no apparent distress Skin: Anteriorly lateral left to the thyroid cartilage, there is  a vertically 2 cm by horizontally 1 cm raised lesion that is not very movable in the cross-sectional plane but freely movable in the sagittal plane.  There is mild tenderness to palpation.  No overlying skin color changes.  There is no drainage or fluctuance.  Otherwise no significant moles, warts, or growths Head:  no masses, lesions, or tenderness Eyes:  pupils equal and round, sclera anicteric without injection Ears:  canals without lesions, TMs shiny without retraction, no obvious effusion, no erythema Nose:  nares patent, septum midline, mucosa normal, and no drainage or sinus tenderness Throat/Pharynx:  lips and gingiva without lesion;  tongue and uvula midline; non-inflamed pharynx; no exudates or postnasal drainage Neck: neck supple without adenopathy, thyromegaly, or masses Lungs:  clear to auscultation, breath sounds equal bilaterally, no respiratory distress Cardio:  regular rate and rhythm, no LE edema Abdomen:  abdomen soft, nontender; bowel sounds normal; no masses or organomegaly Genital: Defer to GYN Musculoskeletal:  symmetrical muscle groups noted without atrophy or deformity Extremities:  no clubbing, cyanosis, or edema, no deformities, no skin discoloration Neuro: Sensation of feet intact to pinprick bilaterally.  Gait normal; deep tendon reflexes normal and symmetric Psych: well oriented with normal range of affect and appropriate judgment/insight  Assessment and Plan  Well adult exam - Plan: CBC, Comprehensive metabolic panel, Lipid panel, TSH  Type 2 diabetes mellitus with hyperglycemia, without long-term current use of insulin (HCC) - Plan: Hemoglobin A1c, Microalbumin / creatinine urine ratio  Screen for colon cancer - Plan: Ambulatory referral to Gastroenterology  Essential hypertension - Plan: olmesartan (BENICAR) 20 MG tablet  Mass of soft tissue of neck - Plan: CT Soft Tissue Neck W Contrast   Well 51 y.o. female. Counseled on diet and exercise. Advanced directive form provided today.  Soft tissue mass: We will check CT of the neck with contrast to rule out malignancy.  Hopefully it is a lipoma or cyst.  Ice, Tylenol if bothersome.  May need to refer to ENT if sinister etiology suspected. Diabetes: Chronic, likely uncontrolled.  She was not able to tolerate the Ozempic.  She is currently taking metformin 1000 mg daily.  Due to this, we will change her to the extended release.  She would prefer to stay away from GLP-1 medications.  We will send in [place] after discussion with her today.  This is assuming she is not controlled with an A1c less than 7. CC: Refer GI Advanced directive form provided  today.  Other orders as above. Follow up in 3 months or as needed. The patient voiced understanding and agreement to the plan.  [place], DO 09/08/21 3:21 PM

## 2021-09-09 ENCOUNTER — Other Ambulatory Visit: Payer: Self-pay | Admitting: Family Medicine

## 2021-09-09 LAB — COMPREHENSIVE METABOLIC PANEL
ALT: 20 U/L (ref 0–35)
AST: 15 U/L (ref 0–37)
Albumin: 4.5 g/dL (ref 3.5–5.2)
Alkaline Phosphatase: 67 U/L (ref 39–117)
BUN: 19 mg/dL (ref 6–23)
CO2: 26 mEq/L (ref 19–32)
Calcium: 9.6 mg/dL (ref 8.4–10.5)
Chloride: 104 mEq/L (ref 96–112)
Creatinine, Ser: 0.83 mg/dL (ref 0.40–1.20)
GFR: 83.17 mL/min (ref >60.00)
Glucose, Bld: 203 mg/dL — ABNORMAL HIGH (ref 70–99)
Potassium: 4.7 mEq/L (ref 3.5–5.1)
Sodium: 139 mEq/L (ref 135–145)
Total Bilirubin: 0.4 mg/dL (ref 0.2–1.2)
Total Protein: 6.8 g/dL (ref 6.0–8.3)

## 2021-09-09 LAB — LIPID PANEL
Cholesterol: 115 mg/dL (ref 0–200)
HDL: 44.4 mg/dL (ref >39.00)
LDL Cholesterol: 37 mg/dL (ref 0–99)
NonHDL: 71.06
Total CHOL/HDL Ratio: 3
Triglycerides: 172 mg/dL — ABNORMAL HIGH (ref 0.0–149.0)
VLDL: 34.4 mg/dL (ref 0.0–40.0)

## 2021-09-09 LAB — MICROALBUMIN / CREATININE URINE RATIO
Creatinine,U: 40.2 mg/dL
Microalb Creat Ratio: 3.6 mg/g (ref 0.0–30.0)
Microalb, Ur: 1.5 mg/dL (ref 0.0–1.9)

## 2021-09-09 LAB — CBC
HCT: 40.5 % (ref 36.0–46.0)
Hemoglobin: 13.6 g/dL (ref 12.0–15.0)
MCHC: 33.7 g/dL (ref 30.0–36.0)
MCV: 90.5 fl (ref 78.0–100.0)
Platelets: 326 10*3/uL (ref 150.0–400.0)
RBC: 4.47 Mil/uL (ref 3.87–5.11)
RDW: 13.2 % (ref 11.5–15.5)
WBC: 12.1 10*3/uL — ABNORMAL HIGH (ref 4.0–10.5)

## 2021-09-09 LAB — HEMOGLOBIN A1C: Hgb A1c MFr Bld: 8.6 % — ABNORMAL HIGH (ref 4.6–6.5)

## 2021-09-09 LAB — TSH: TSH: 2.45 u[IU]/mL (ref 0.35–5.50)

## 2021-09-09 MED ORDER — DAPAGLIFLOZIN PROPANEDIOL 10 MG PO TABS: 10.0000 mg | ORAL_TABLET | Freq: Every day | ORAL | 5 refills | Status: DC

## 2021-09-15 ENCOUNTER — Ambulatory Visit (INDEPENDENT_AMBULATORY_CARE_PROVIDER_SITE_OTHER): Payer: BC Managed Care – PPO

## 2021-09-15 DIAGNOSIS — M7989 Other specified soft tissue disorders: Secondary | ICD-10-CM | POA: Diagnosis not present

## 2021-09-15 DIAGNOSIS — R221 Localized swelling, mass and lump, neck: Secondary | ICD-10-CM

## 2021-09-15 DIAGNOSIS — E041 Nontoxic single thyroid nodule: Secondary | ICD-10-CM | POA: Diagnosis not present

## 2021-09-15 MED ORDER — IOHEXOL 300 MG/ML  SOLN
100.0000 mL | Freq: Once | INTRAMUSCULAR | Status: AC | PRN
  Administered 2021-09-15: 75 mL via INTRAVENOUS

## 2021-09-16 ENCOUNTER — Other Ambulatory Visit: Payer: Self-pay | Admitting: Family Medicine

## 2021-09-16 DIAGNOSIS — E041 Nontoxic single thyroid nodule: Secondary | ICD-10-CM

## 2021-09-23 ENCOUNTER — Ambulatory Visit (HOSPITAL_BASED_OUTPATIENT_CLINIC_OR_DEPARTMENT_OTHER)
Admission: RE | Admit: 2021-09-23 | Discharge: 2021-09-23 | Disposition: A | Discharge status: Home / Self Care | Payer: BC Managed Care – PPO | Source: Ambulatory Visit | Attending: Family Medicine | Admitting: Family Medicine

## 2021-09-23 DIAGNOSIS — E042 Nontoxic multinodular goiter: Secondary | ICD-10-CM | POA: Diagnosis not present

## 2021-09-23 DIAGNOSIS — E041 Nontoxic single thyroid nodule: Secondary | ICD-10-CM | POA: Insufficient documentation

## 2021-10-17 IMAGING — DX DG KNEE COMPLETE 4+V*L*
4 series · 4 of 4 positions shown · non-contrast
Comparison: None

CLINICAL DATA: LEFT knee pain diffusely, greater laterally, pain
for years increased in last 4-5 days, no injury

EXAM:
LEFT KNEE - COMPLETE 4+ VIEW

[knee ap]
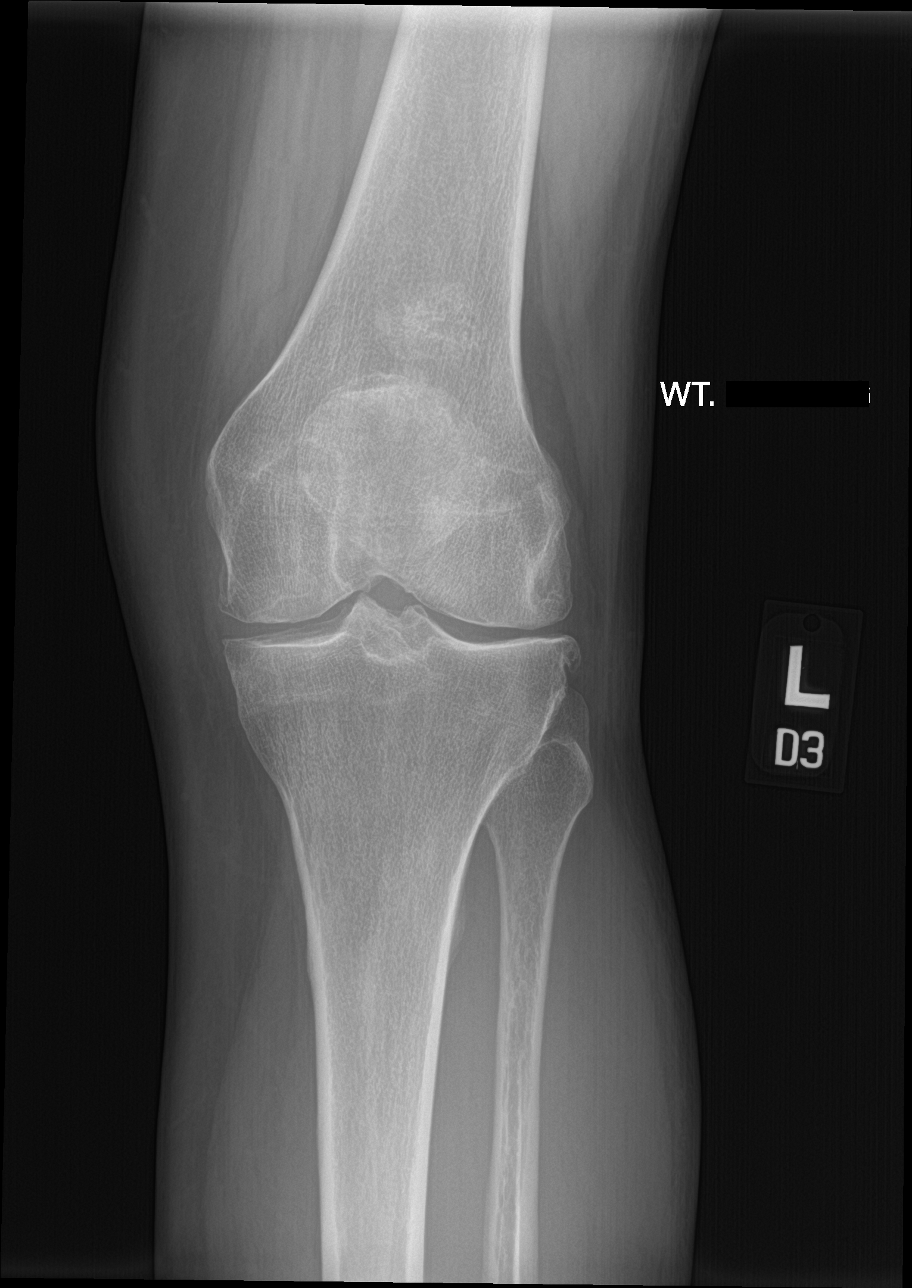

[tunnel]
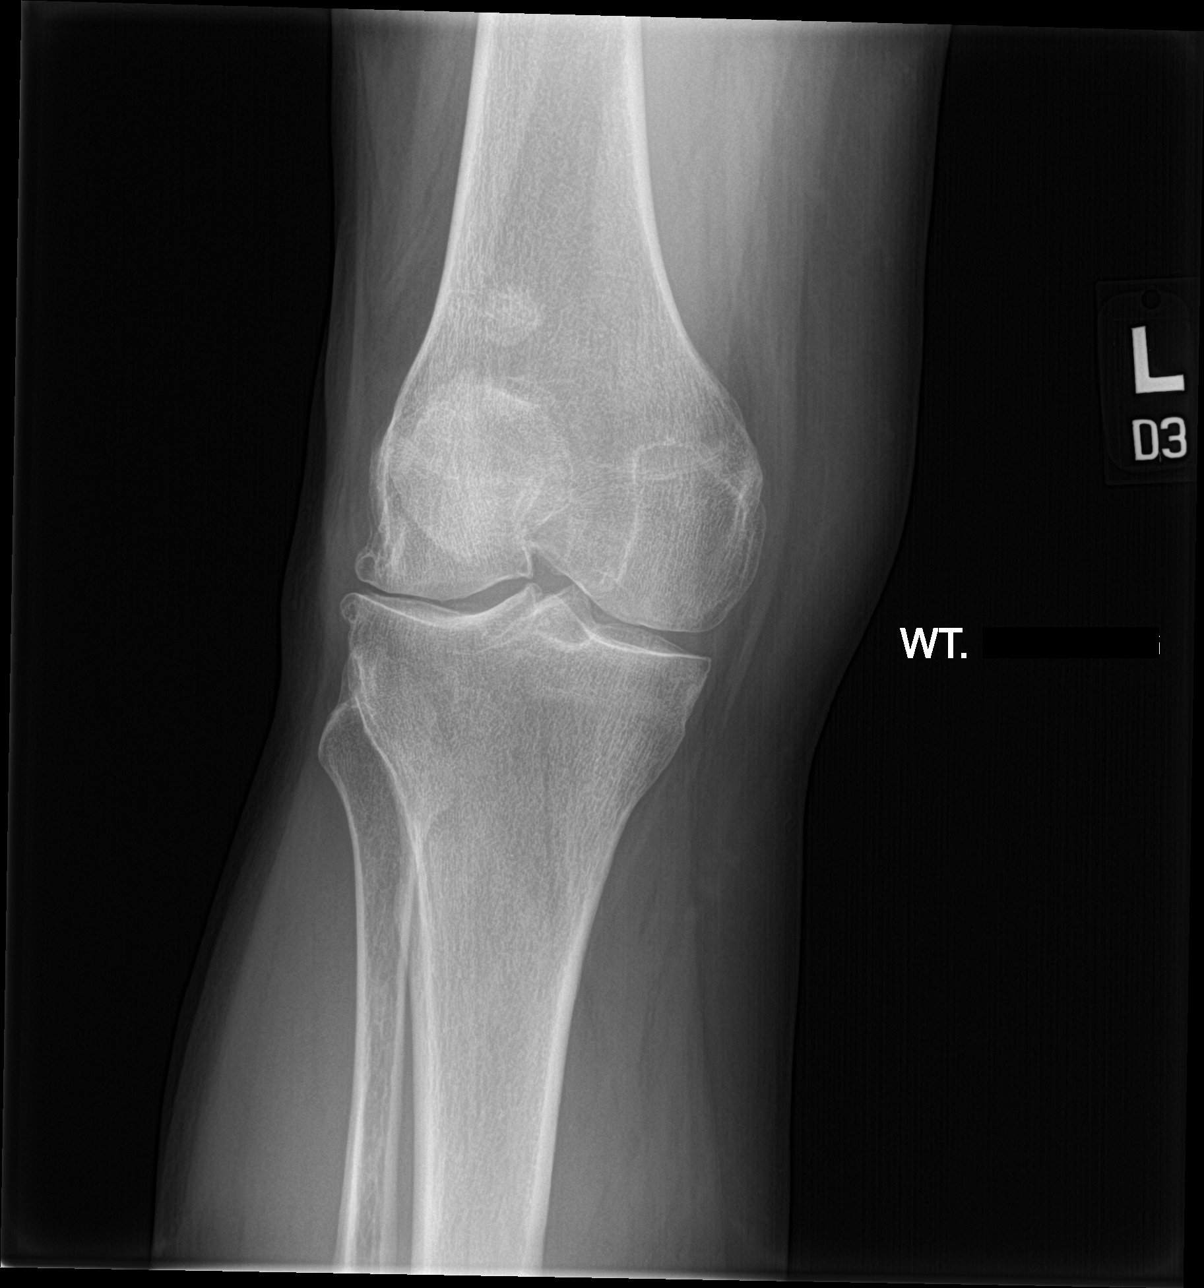

[knee lat]
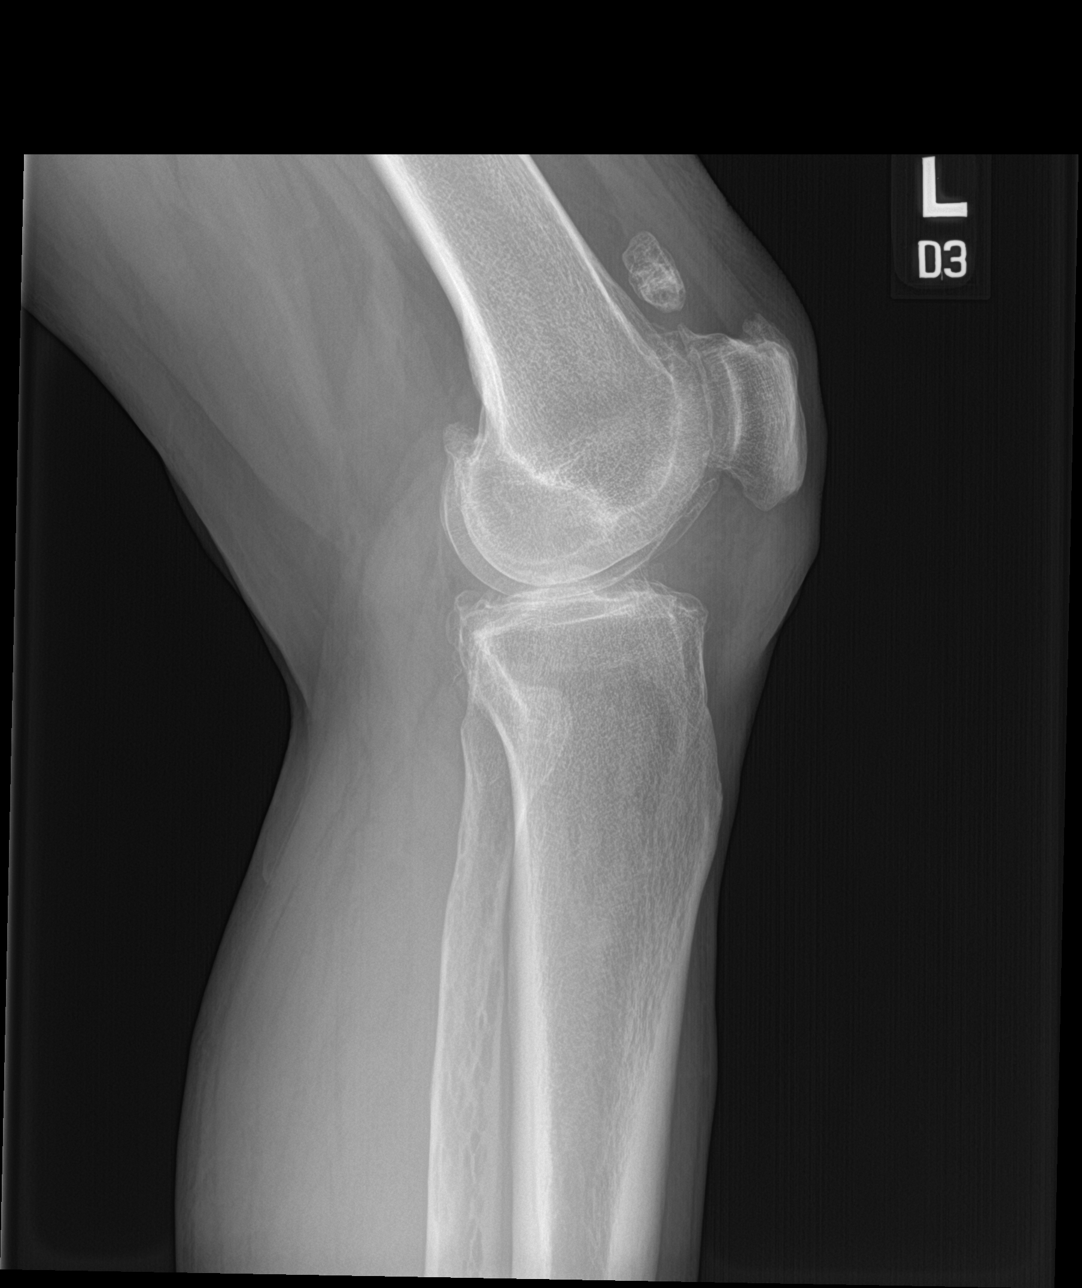

[knee sunrise]
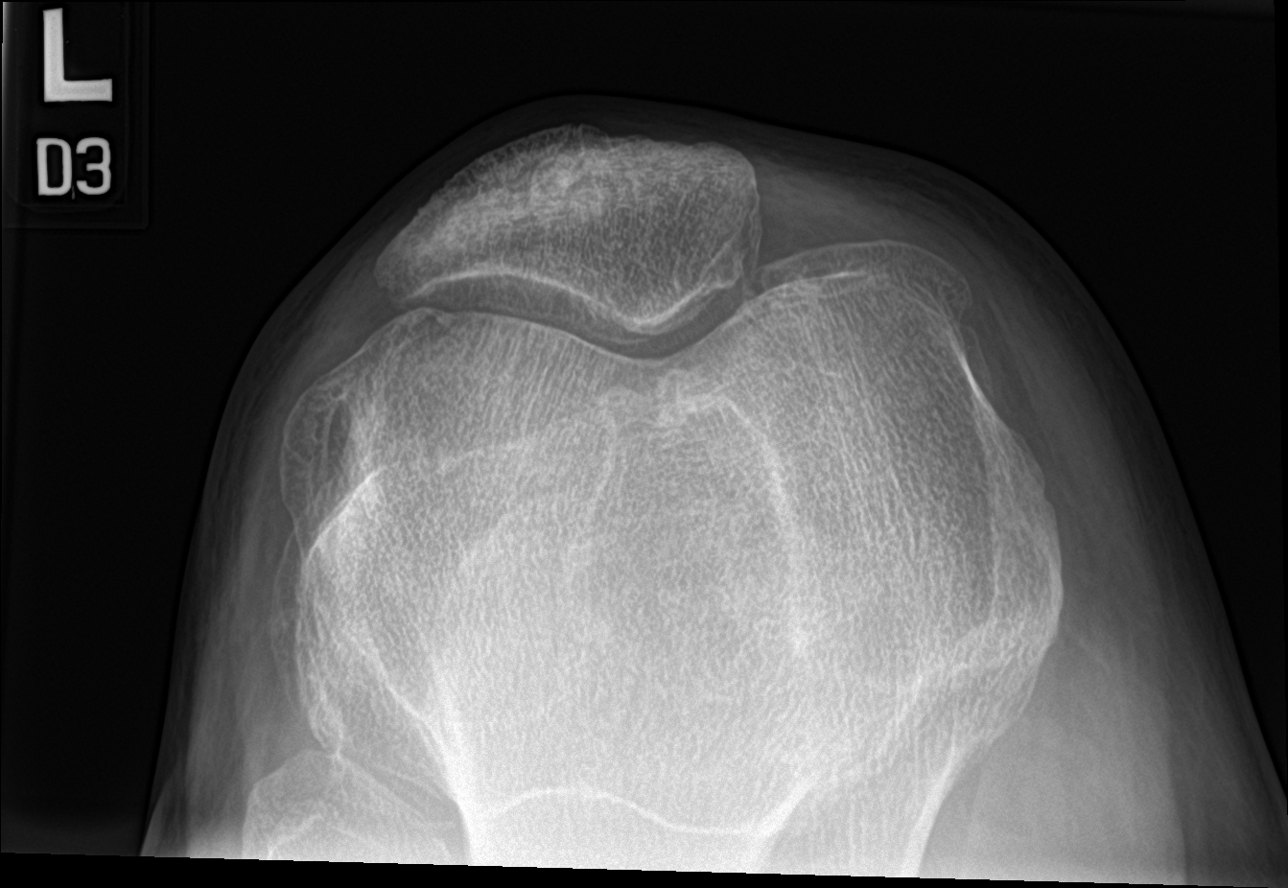

[4 of 4 positions shown; findings below may reference images not displayed]

FINDINGS: Osseous demineralization.

Diffuse joint space narrowing greatest at patellofemoral joint.

Large calcified loose body in suprapatellar recess.

No acute fracture, dislocation, or bone destruction.

No significant joint effusion.
IMPRESSION: Tricompartmental degenerative changes LEFT knee with large calcified
loose body in suprapatellar recess.

## 2021-10-20 IMAGING — DX DG ORBITS FOR FOREIGN BODY
3 series · 3 of 3 positions shown · non-contrast
Comparison: None.

CLINICAL DATA: Metal working/exposure; clearance prior to MRI

EXAM:
ORBITS FOR FOREIGN BODY - 2 VIEW

[orbits waters (1 of 3)]
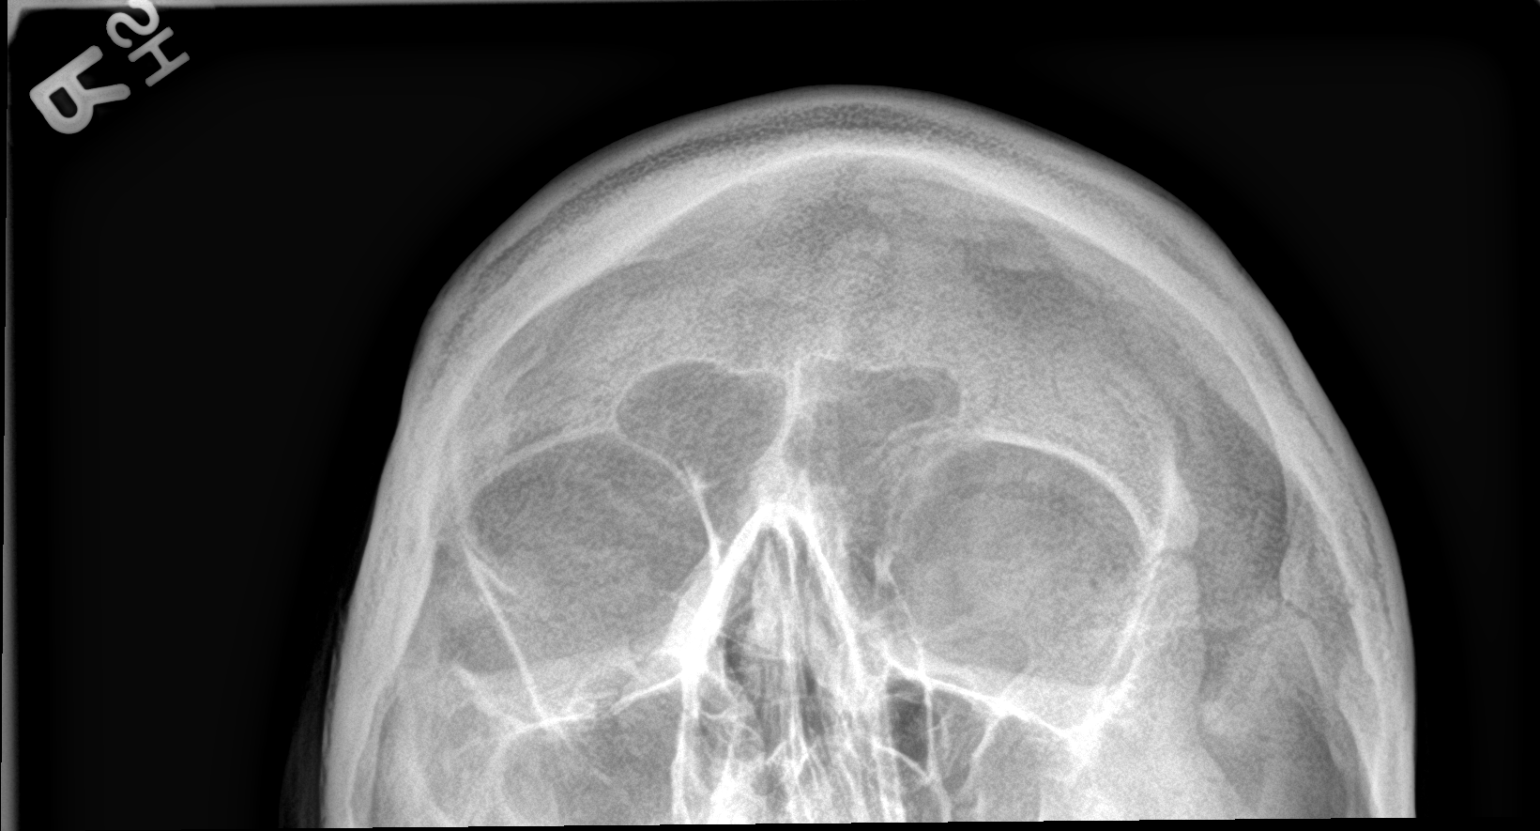

[orbits waters (2 of 3)]
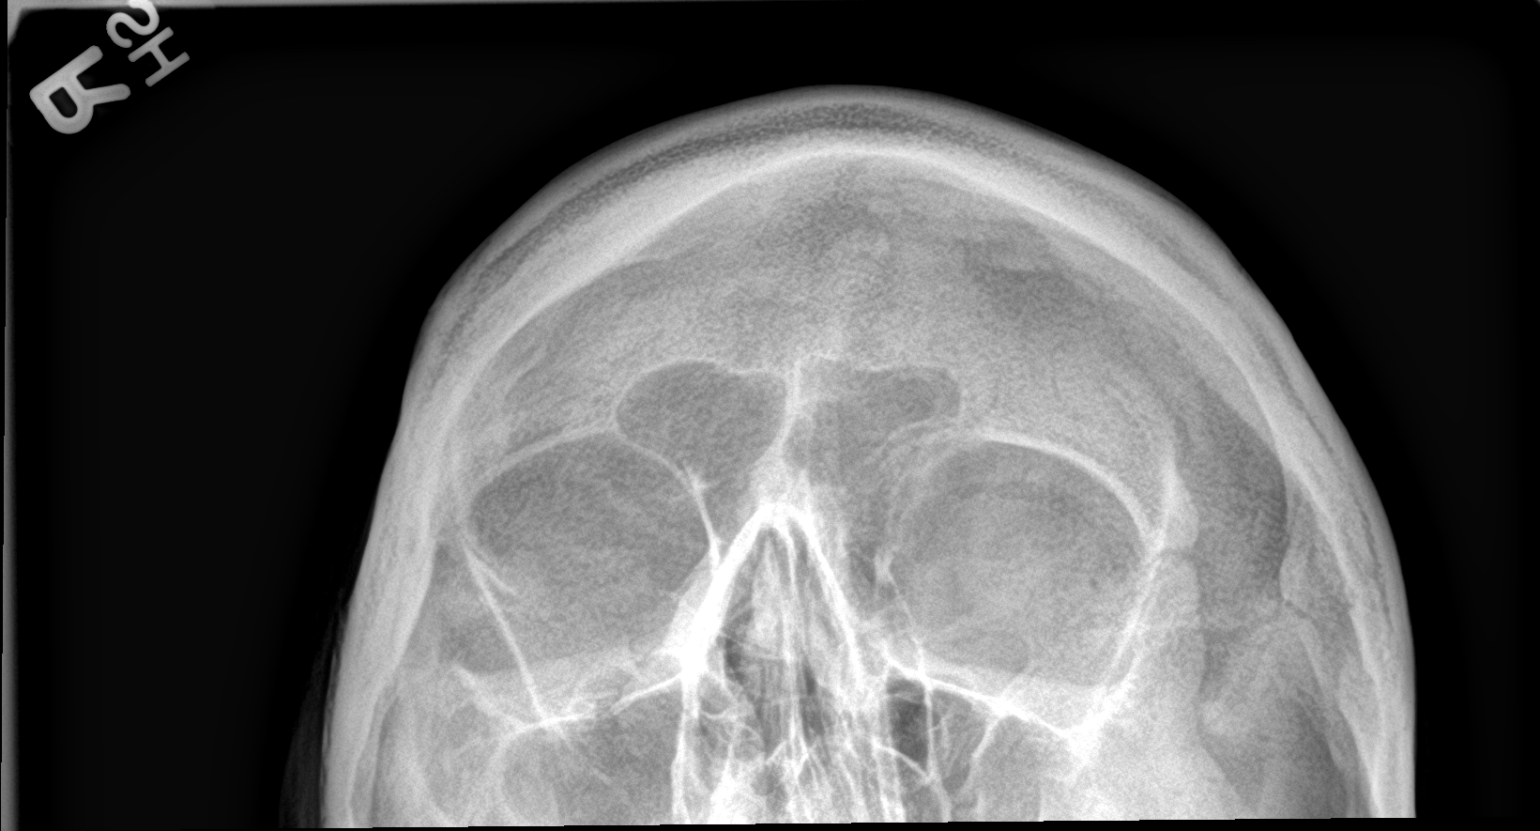

[orbits waters (3 of 3)]
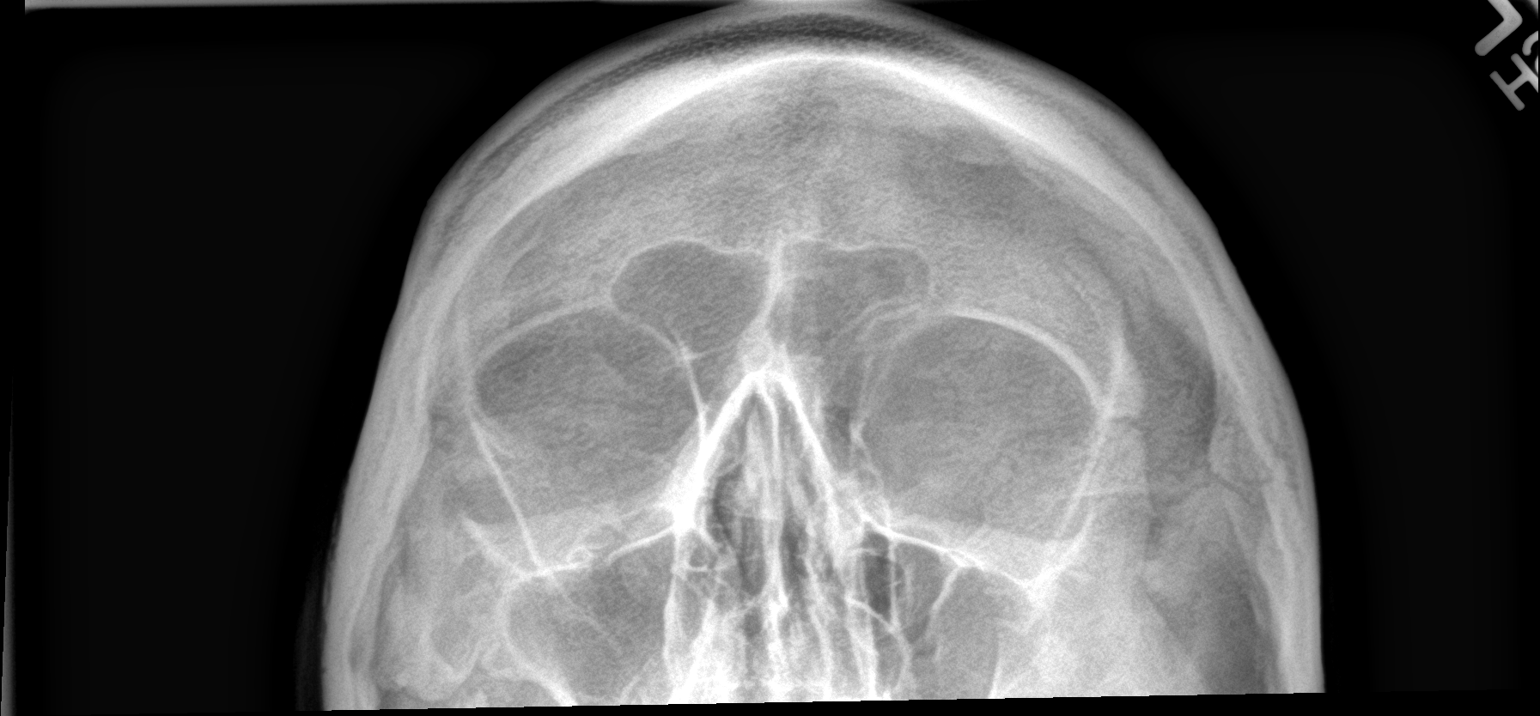

[3 of 3 positions shown; findings below may reference images not displayed]

FINDINGS: There is no evidence of metallic foreign body within the orbits. No
significant bone abnormality identified.
IMPRESSION: No evidence of metallic foreign body within the orbits.

## 2021-11-01 ENCOUNTER — Telehealth: Payer: Self-pay | Admitting: Family Medicine

## 2021-11-01 NOTE — Telephone Encounter (Signed)
Pt called stating that she believes she has been passing whatever she has for a sore throat to her husband as he had expressed similar symptoms. She is wondering if what she has is bacterial in nature and wants to know if she needs to be put on an antibiotic.

## 2021-11-01 NOTE — Telephone Encounter (Signed)
Needs appt

## 2021-11-02 DIAGNOSIS — Z3202 Encounter for pregnancy test, result negative: Secondary | ICD-10-CM | POA: Diagnosis not present

## 2021-11-02 DIAGNOSIS — Z3042 Encounter for surveillance of injectable contraceptive: Secondary | ICD-10-CM | POA: Diagnosis not present

## 2021-12-09 DIAGNOSIS — T1502XA Foreign body in cornea, left eye, initial encounter: Secondary | ICD-10-CM | POA: Diagnosis not present

## 2021-12-24 DIAGNOSIS — H2513 Age-related nuclear cataract, bilateral: Secondary | ICD-10-CM | POA: Diagnosis not present

## 2021-12-24 DIAGNOSIS — H35033 Hypertensive retinopathy, bilateral: Secondary | ICD-10-CM | POA: Diagnosis not present

## 2021-12-24 DIAGNOSIS — E119 Type 2 diabetes mellitus without complications: Secondary | ICD-10-CM | POA: Diagnosis not present

## 2021-12-24 DIAGNOSIS — T1502XD Foreign body in cornea, left eye, subsequent encounter: Secondary | ICD-10-CM | POA: Diagnosis not present

## 2022-01-19 DIAGNOSIS — Z3042 Encounter for surveillance of injectable contraceptive: Secondary | ICD-10-CM | POA: Diagnosis not present

## 2022-02-15 DIAGNOSIS — Z23 Encounter for immunization: Secondary | ICD-10-CM | POA: Diagnosis not present

## 2022-02-15 DIAGNOSIS — Z01419 Encounter for gynecological examination (general) (routine) without abnormal findings: Secondary | ICD-10-CM | POA: Diagnosis not present

## 2022-03-29 ENCOUNTER — Other Ambulatory Visit: Payer: Self-pay | Admitting: Family Medicine

## 2022-04-19 ENCOUNTER — Other Ambulatory Visit: Payer: Self-pay | Admitting: Family Medicine

## 2022-04-19 DIAGNOSIS — Z3042 Encounter for surveillance of injectable contraceptive: Secondary | ICD-10-CM | POA: Diagnosis not present

## 2022-04-19 DIAGNOSIS — I10 Essential (primary) hypertension: Secondary | ICD-10-CM

## 2022-04-19 MED ORDER — OLMESARTAN MEDOXOMIL 20 MG PO TABS: ORAL_TABLET | ORAL | 5 refills | Status: DC

## 2022-05-04 ENCOUNTER — Other Ambulatory Visit: Payer: Self-pay | Admitting: Family Medicine

## 2022-06-24 ENCOUNTER — Encounter: Payer: Self-pay | Admitting: Family Medicine

## 2022-06-24 ENCOUNTER — Ambulatory Visit: Payer: BC Managed Care – PPO | Admitting: Family Medicine

## 2022-06-24 VITALS — BP 120/82 | HR 75 | Temp 98.0°F | Ht 64.0 in | Wt 166.1 lb

## 2022-06-24 DIAGNOSIS — I10 Essential (primary) hypertension: Secondary | ICD-10-CM | POA: Diagnosis not present

## 2022-06-24 DIAGNOSIS — E1165 Type 2 diabetes mellitus with hyperglycemia: Secondary | ICD-10-CM | POA: Diagnosis not present

## 2022-06-24 DIAGNOSIS — Z1211 Encounter for screening for malignant neoplasm of colon: Secondary | ICD-10-CM

## 2022-06-24 MED ORDER — DAPAGLIFLOZIN PROPANEDIOL 10 MG PO TABS: ORAL_TABLET | ORAL | 5 refills | Status: DC

## 2022-06-24 MED ORDER — ATORVASTATIN CALCIUM 40 MG PO TABS: ORAL_TABLET | ORAL | 6 refills | Status: DC

## 2022-06-24 NOTE — Progress Notes (Signed)
Subjective:   Chief Complaint  Patient presents with   Follow-up    Medication refills    Brooke Medina is a 52 y.o. female here for follow-up of diabetes.   Brooke Medina does not routinely monitor her sugars.  Patient does not require insulin.   Medications include: Farxiga 10 mg/d, metformin XR 1000 mg/d Diet is OK.  Exercise: none  Hypertension Patient presents for hypertension follow up. She does not monitor home blood pressures. She is compliant with medications- Benicar 20 mg/d, Toprol XL 50 mg/d. Patient has these side effects of medication: none Diet/exercise as above. No CP or SOB.   Past Medical History:  Diagnosis Date   Diabetes mellitus type 2 in obese ([place]) 02/17/2017   Hypertension      Related testing: Retinal exam: Done Pneumovax: done  Objective:  BP 120/82 (BP Location: Left Arm, Patient Position: Sitting, Cuff Size: Normal)   Pulse 75   Temp 98 F (36.7 C) (Oral)   Ht '5\' 4"'$  (1.626 m)   Wt 166 lb 2 oz (75.4 kg)   SpO2 97%   BMI 28.52 kg/m  General:  Well developed, well nourished, in no apparent distress Skin:  Warm, no pallor or diaphoresis on exposed skin  Lungs:  CTAB, no access msc use Cardio:  RRR, no bruits, no LE edema Psych: Age appropriate judgment and insight  Assessment:   Type 2 diabetes mellitus with hyperglycemia, without long-term current use of insulin (HCC) - Plan: dapagliflozin propanediol (FARXIGA) 10 MG TABS tablet, atorvastatin (LIPITOR) 40 MG tablet, Hemoglobin A1c, Lipid panel, Comprehensive metabolic panel  Essential hypertension  Screen for colon cancer - Plan: Ambulatory referral to Gastroenterology   Plan:   Chronic, hopefully controlled.  Continue Farxiga 10 mg daily, metformin XR 1000 mg daily.  Encouraged her to check sugars since we are unsure if she is stable.  Counseled on diet and exercise. Chronic, stable.  Continue Benicar 20 mg daily, metoprolol XL 50 mg daily. Needs to schedule colonoscopy, referral  placed today. Smoking cessation encouraged.  Compliance with statin medication encouraged. F/u in 3 mo. The patient voiced understanding and agreement to the plan.  [place], DO 06/24/22 4:48 PM

## 2022-06-24 NOTE — Patient Instructions (Signed)
Give us 2-3 business days to get the results of your labs back.   Keep the diet clean and stay active.  If you do not hear anything about your referral in the next 1-2 weeks, call our office and ask for an update.  Let us know if you need anything. 

## 2022-06-25 ENCOUNTER — Other Ambulatory Visit: Payer: Self-pay | Admitting: Family Medicine

## 2022-06-25 LAB — HEMOGLOBIN A1C
Hgb A1c MFr Bld: 7.5 % of total Hgb — ABNORMAL HIGH (ref <5.7)
Mean Plasma Glucose: 169 mg/dL
eAG (mmol/L): 9.3 mmol/L

## 2022-06-25 LAB — COMPREHENSIVE METABOLIC PANEL
AG Ratio: 1.8 (calc) (ref 1.0–2.5)
ALT: 37 U/L — ABNORMAL HIGH (ref 6–29)
AST: 22 U/L (ref 10–35)
Albumin: 4.4 g/dL (ref 3.6–5.1)
Alkaline phosphatase (APISO): 63 U/L (ref 31–125)
BUN/Creatinine Ratio: 21 (calc) (ref 6–22)
BUN: 22 mg/dL (ref 7–25)
CO2: 23 mmol/L (ref 20–32)
Calcium: 9.5 mg/dL (ref 8.6–10.2)
Chloride: 107 mmol/L (ref 98–110)
Creat: 1.05 mg/dL — ABNORMAL HIGH (ref 0.50–0.99)
Globulin: 2.4 g/dL (calc) (ref 1.9–3.7)
Glucose, Bld: 140 mg/dL — ABNORMAL HIGH (ref 65–99)
Potassium: 4.5 mmol/L (ref 3.5–5.3)
Sodium: 141 mmol/L (ref 135–146)
Total Bilirubin: 0.4 mg/dL (ref 0.2–1.2)
Total Protein: 6.8 g/dL (ref 6.1–8.1)

## 2022-06-25 LAB — LIPID PANEL
Cholesterol: 190 mg/dL (ref <200)
HDL: 59 mg/dL (ref >=50)
LDL Cholesterol (Calc): 101 mg/dL (calc) — ABNORMAL HIGH
Non-HDL Cholesterol (Calc): 131 mg/dL (calc) — ABNORMAL HIGH (ref <130)
Total CHOL/HDL Ratio: 3.2 (calc) (ref <5.0)
Triglycerides: 189 mg/dL — ABNORMAL HIGH (ref <150)

## 2022-06-25 MED ORDER — TIRZEPATIDE 7.5 MG/0.5ML ~~LOC~~ SOAJ: 7.5000 mg | SUBCUTANEOUS | 0 refills | Status: AC

## 2022-06-25 MED ORDER — TIRZEPATIDE 5 MG/0.5ML ~~LOC~~ SOAJ: 5.0000 mg | SUBCUTANEOUS | 0 refills | Status: AC

## 2022-06-25 MED ORDER — TIRZEPATIDE 2.5 MG/0.5ML ~~LOC~~ SOAJ: 2.5000 mg | SUBCUTANEOUS | 0 refills | Status: AC

## 2022-06-27 ENCOUNTER — Other Ambulatory Visit: Payer: Self-pay | Admitting: Family Medicine

## 2022-06-27 DIAGNOSIS — R945 Abnormal results of liver function studies: Secondary | ICD-10-CM

## 2022-06-27 MED ORDER — EMPAGLIFLOZIN 25 MG PO TABS: 25.0000 mg | ORAL_TABLET | Freq: Every day | ORAL | 5 refills | Status: DC

## 2022-07-04 ENCOUNTER — Other Ambulatory Visit: Payer: BC Managed Care – PPO

## 2022-07-05 DIAGNOSIS — Z3042 Encounter for surveillance of injectable contraceptive: Secondary | ICD-10-CM | POA: Diagnosis not present

## 2022-08-01 ENCOUNTER — Encounter: Payer: Self-pay | Admitting: *Deleted

## 2022-08-11 ENCOUNTER — Encounter: Payer: Self-pay | Admitting: Family Medicine

## 2022-08-11 ENCOUNTER — Other Ambulatory Visit: Payer: Self-pay | Admitting: Family Medicine

## 2022-08-11 DIAGNOSIS — K219 Gastro-esophageal reflux disease without esophagitis: Secondary | ICD-10-CM

## 2022-08-11 MED ORDER — PANTOPRAZOLE SODIUM 40 MG PO TBEC: 40.0000 mg | DELAYED_RELEASE_TABLET | Freq: Every day | ORAL | 3 refills | Status: DC

## 2022-09-19 ENCOUNTER — Other Ambulatory Visit: Payer: Self-pay | Admitting: Family Medicine

## 2022-09-19 DIAGNOSIS — E041 Nontoxic single thyroid nodule: Secondary | ICD-10-CM

## 2022-09-20 DIAGNOSIS — Z3042 Encounter for surveillance of injectable contraceptive: Secondary | ICD-10-CM | POA: Diagnosis not present

## 2022-09-26 ENCOUNTER — Telehealth: Payer: Self-pay | Admitting: Family Medicine

## 2022-09-26 NOTE — Telephone Encounter (Signed)
The patient has not started Jardiance. She has Mounjaro in the frig and has not started it yet. She is confused what she is take

## 2022-09-26 NOTE — Telephone Encounter (Signed)
Pt called to cancel appt for tomorrow. She has appt with surgeon. Pt said she needs to speak to Zella Ball as well because she didn't start the medication as soon as wendling would have liked so she isn't sure when to r/s. Please call her back to discuss.

## 2022-09-26 NOTE — Telephone Encounter (Signed)
Have her start with just the Jardiance please. Ty.

## 2022-09-26 NOTE — Telephone Encounter (Signed)
The Jardiance? Have her start it and f/u in 1 month. Ty.

## 2022-09-27 ENCOUNTER — Ambulatory Visit: Payer: BC Managed Care – PPO | Admitting: Family Medicine

## 2022-09-27 DIAGNOSIS — M2342 Loose body in knee, left knee: Secondary | ICD-10-CM | POA: Diagnosis not present

## 2022-09-27 NOTE — Telephone Encounter (Signed)
Called left message to call back 

## 2022-09-27 NOTE — Telephone Encounter (Signed)
Called informed of PCP instructions. She verbalized understanding. 

## 2022-09-28 ENCOUNTER — Other Ambulatory Visit: Payer: Self-pay | Admitting: Family Medicine

## 2022-09-28 ENCOUNTER — Telehealth: Payer: Self-pay | Admitting: Family Medicine

## 2022-09-28 DIAGNOSIS — I10 Essential (primary) hypertension: Secondary | ICD-10-CM

## 2022-09-28 MED ORDER — METOPROLOL SUCCINATE ER 50 MG PO TB24: ORAL_TABLET | ORAL | 5 refills | Status: DC

## 2022-09-28 NOTE — Telephone Encounter (Signed)
Sent in and patient aware 

## 2022-09-28 NOTE — Addendum Note (Signed)
Addended by: Scharlene Gloss B on: 09/28/2022 04:57 PM   Modules accepted: Orders

## 2022-09-28 NOTE — Telephone Encounter (Signed)
Prescription Request  09/28/2022  Is this a "Controlled Substance" medicine? No  LOV: 06/24/2022  What is the name of the medication or equipment?   metoprolol succinate (TOPROL-XL) 50 MG 24 hr tablet [[id number]]   Have you contacted your pharmacy to request a refill? No   Which pharmacy would you like this sent to?   [id number] - [place], [place] - [address] [place] [address] Phone: [phone] Fax: [phone]  Patient notified that their request is being sent to the clinical staff for review and that they should receive a response within 2 business days.   Please advise at Mobile [phone] (mobile)

## 2022-10-06 ENCOUNTER — Ambulatory Visit (HOSPITAL_BASED_OUTPATIENT_CLINIC_OR_DEPARTMENT_OTHER)
Admission: RE | Admit: 2022-10-06 | Discharge: 2022-10-06 | Disposition: A | Discharge status: Home / Self Care | Payer: BC Managed Care – PPO | Source: Ambulatory Visit | Attending: Family Medicine | Admitting: Family Medicine

## 2022-10-06 DIAGNOSIS — E041 Nontoxic single thyroid nodule: Secondary | ICD-10-CM | POA: Insufficient documentation

## 2022-10-24 DIAGNOSIS — M2342 Loose body in knee, left knee: Secondary | ICD-10-CM | POA: Diagnosis not present

## 2022-10-24 DIAGNOSIS — M94262 Chondromalacia, left knee: Secondary | ICD-10-CM | POA: Diagnosis not present

## 2022-10-28 DIAGNOSIS — M2342 Loose body in knee, left knee: Secondary | ICD-10-CM | POA: Diagnosis not present

## 2022-10-28 DIAGNOSIS — S83282A Other tear of lateral meniscus, current injury, left knee, initial encounter: Secondary | ICD-10-CM | POA: Diagnosis not present

## 2022-10-28 DIAGNOSIS — M65862 Other synovitis and tenosynovitis, left lower leg: Secondary | ICD-10-CM | POA: Diagnosis not present

## 2022-10-28 DIAGNOSIS — M94262 Chondromalacia, left knee: Secondary | ICD-10-CM | POA: Diagnosis not present

## 2022-11-08 DIAGNOSIS — Z4789 Encounter for other orthopedic aftercare: Secondary | ICD-10-CM | POA: Diagnosis not present

## 2022-12-02 DIAGNOSIS — Z3042 Encounter for surveillance of injectable contraceptive: Secondary | ICD-10-CM | POA: Diagnosis not present

## 2022-12-13 DIAGNOSIS — Z4789 Encounter for other orthopedic aftercare: Secondary | ICD-10-CM | POA: Diagnosis not present

## 2023-01-05 ENCOUNTER — Other Ambulatory Visit: Payer: Self-pay | Admitting: Family Medicine

## 2023-02-20 DIAGNOSIS — Z3042 Encounter for surveillance of injectable contraceptive: Secondary | ICD-10-CM | POA: Diagnosis not present

## 2023-02-23 DIAGNOSIS — J209 Acute bronchitis, unspecified: Secondary | ICD-10-CM | POA: Diagnosis not present

## 2023-03-19 ENCOUNTER — Other Ambulatory Visit: Payer: Self-pay | Admitting: Family Medicine

## 2023-03-21 DIAGNOSIS — Z1151 Encounter for screening for human papillomavirus (HPV): Secondary | ICD-10-CM | POA: Diagnosis not present

## 2023-03-21 DIAGNOSIS — Z01419 Encounter for gynecological examination (general) (routine) without abnormal findings: Secondary | ICD-10-CM | POA: Diagnosis not present

## 2023-03-21 DIAGNOSIS — Z124 Encounter for screening for malignant neoplasm of cervix: Secondary | ICD-10-CM | POA: Diagnosis not present

## 2023-03-21 DIAGNOSIS — Z793 Long term (current) use of hormonal contraceptives: Secondary | ICD-10-CM | POA: Diagnosis not present

## 2023-03-24 ENCOUNTER — Ambulatory Visit: Payer: BC Managed Care – PPO | Admitting: Family Medicine

## 2023-03-24 ENCOUNTER — Encounter: Payer: Self-pay | Admitting: Family Medicine

## 2023-03-24 VITALS — BP 136/82 | HR 84 | Temp 107.0°F | Resp 16 | Ht 64.0 in | Wt 168.0 lb

## 2023-03-24 DIAGNOSIS — J069 Acute upper respiratory infection, unspecified: Secondary | ICD-10-CM | POA: Diagnosis not present

## 2023-03-24 DIAGNOSIS — H6993 Unspecified Eustachian tube disorder, bilateral: Secondary | ICD-10-CM

## 2023-03-24 DIAGNOSIS — Z7984 Long term (current) use of oral hypoglycemic drugs: Secondary | ICD-10-CM

## 2023-03-24 DIAGNOSIS — E1165 Type 2 diabetes mellitus with hyperglycemia: Secondary | ICD-10-CM | POA: Diagnosis not present

## 2023-03-24 LAB — POCT INFLUENZA A/B
Influenza A, POC: NEGATIVE
Influenza B, POC: NEGATIVE

## 2023-03-24 LAB — POCT RAPID STREP A (OFFICE): Rapid Strep A Screen: NEGATIVE

## 2023-03-24 MED ORDER — PREDNISONE 20 MG PO TABS: 40.0000 mg | ORAL_TABLET | Freq: Every day | ORAL | 0 refills | Status: AC

## 2023-03-24 NOTE — Addendum Note (Signed)
Addended by: Kathi Ludwig on: 03/24/2023 03:54 PM   Modules accepted: Orders

## 2023-03-24 NOTE — Addendum Note (Signed)
Addended by: Mervin Kung A on: 03/24/2023 03:08 PM   Modules accepted: Orders

## 2023-03-24 NOTE — Progress Notes (Signed)
Chief Complaint  Patient presents with   Sore Throat    Sore throat and headaches    Brooke Medina here for URI complaints.  Duration: 3 days  Associated symptoms: sinus headache, ear pain, sore throat, and swollen glands Denies: sinus congestion, sinus pain, rhinorrhea, itchy watery eyes, ear drainage, wheezing, shortness of breath, myalgia, and fevers, cough Treatment to date: salt water gargles, Nyquil, ibuprofen Sick contacts: Yes- going out at work  Past Medical History:  Diagnosis Date   Diabetes mellitus type 2 in obese 02/17/2017   Hypertension     Objective BP 136/82 (BP Location: Left Arm, Patient Position: Sitting, Cuff Size: Normal)   Pulse 84   Temp (!) 107 F (41.7 C) (Oral)   Resp 16   Ht 5\' 4"  (1.626 m)   Wt 168 lb (76.2 kg)   SpO2 97%   BMI 28.84 kg/m  General: Awake, alert, appears stated age HEENT: AT, [place], ears patent b/l and TM's neg, nares patent w/o discharge, pharynx pink and without exudates, MMM, no sinus ttp Neck: No masses or asymmetry Heart: RRR Lungs: CTAB, no accessory muscle use Psych: Age appropriate judgment and insight, normal mood and affect  Viral upper respiratory tract infection  Type 2 diabetes mellitus with hyperglycemia, without long-term current use of insulin (HCC) - Plan: Microalbumin / creatinine urine ratio  Dysfunction of both eustachian tubes - Plan: predniSONE (DELTASONE) 20 MG tablet  5 d pred burst 40 mg/d. Continue to push fluids, practice good hand hygiene, cover mouth when coughing. F/u prn. If starting to experience fevers, shaking, or shortness of breath, seek immediate care. Pt voiced understanding and agreement to the plan.  [name], DO 03/24/23 3:04 PM

## 2023-03-24 NOTE — Patient Instructions (Signed)
Continue to push fluids, practice good hand hygiene, and cover your mouth if you cough.  If you start having fevers, shaking or shortness of breath, seek immediate care.  Keep the diet clean and stay active.  Let us know if you need anything.

## 2023-03-25 LAB — MICROALBUMIN / CREATININE URINE RATIO
Creatinine, Urine: 67 mg/dL (ref 20–275)
Microalb Creat Ratio: 145 mg/g{creat} — ABNORMAL HIGH (ref <30)
Microalb, Ur: 9.7 mg/dL

## 2023-03-27 DIAGNOSIS — Z1239 Encounter for other screening for malignant neoplasm of breast: Secondary | ICD-10-CM | POA: Diagnosis not present

## 2023-03-27 DIAGNOSIS — Z1231 Encounter for screening mammogram for malignant neoplasm of breast: Secondary | ICD-10-CM | POA: Diagnosis not present

## 2023-03-27 LAB — HM MAMMOGRAPHY

## 2023-03-30 ENCOUNTER — Encounter: Payer: Self-pay | Admitting: Family Medicine

## 2023-03-31 ENCOUNTER — Other Ambulatory Visit: Payer: Self-pay | Admitting: Family Medicine

## 2023-03-31 ENCOUNTER — Encounter: Payer: Self-pay | Admitting: Family Medicine

## 2023-03-31 MED ORDER — AZITHROMYCIN 250 MG PO TABS: ORAL_TABLET | ORAL | 0 refills | Status: DC

## 2023-04-19 ENCOUNTER — Other Ambulatory Visit: Payer: Self-pay | Admitting: Family Medicine

## 2023-04-19 ENCOUNTER — Encounter: Payer: Self-pay | Admitting: Family Medicine

## 2023-04-20 MED ORDER — EMPAGLIFLOZIN 25 MG PO TABS: 25.0000 mg | ORAL_TABLET | Freq: Every day | ORAL | 5 refills | Status: DC

## 2023-05-09 DIAGNOSIS — Z3042 Encounter for surveillance of injectable contraceptive: Secondary | ICD-10-CM | POA: Diagnosis not present

## 2023-05-29 ENCOUNTER — Other Ambulatory Visit: Payer: Self-pay

## 2023-05-29 DIAGNOSIS — I10 Essential (primary) hypertension: Secondary | ICD-10-CM

## 2023-05-29 MED ORDER — OLMESARTAN MEDOXOMIL 20 MG PO TABS: ORAL_TABLET | ORAL | 5 refills | Status: DC

## 2023-06-19 DIAGNOSIS — K621 Rectal polyp: Secondary | ICD-10-CM | POA: Diagnosis not present

## 2023-06-19 DIAGNOSIS — D125 Benign neoplasm of sigmoid colon: Secondary | ICD-10-CM | POA: Diagnosis not present

## 2023-06-19 DIAGNOSIS — K635 Polyp of colon: Secondary | ICD-10-CM | POA: Diagnosis not present

## 2023-06-19 DIAGNOSIS — K648 Other hemorrhoids: Secondary | ICD-10-CM | POA: Diagnosis not present

## 2023-06-19 DIAGNOSIS — D128 Benign neoplasm of rectum: Secondary | ICD-10-CM | POA: Diagnosis not present

## 2023-06-19 DIAGNOSIS — Z1211 Encounter for screening for malignant neoplasm of colon: Secondary | ICD-10-CM | POA: Diagnosis not present

## 2023-06-19 LAB — HM COLONOSCOPY

## 2023-06-26 ENCOUNTER — Encounter: Payer: Self-pay | Admitting: Family Medicine

## 2023-06-26 ENCOUNTER — Other Ambulatory Visit: Payer: Self-pay

## 2023-06-26 DIAGNOSIS — I10 Essential (primary) hypertension: Secondary | ICD-10-CM

## 2023-06-26 MED ORDER — OLMESARTAN MEDOXOMIL 20 MG PO TABS: ORAL_TABLET | ORAL | 5 refills | Status: DC

## 2023-07-26 DIAGNOSIS — Z3042 Encounter for surveillance of injectable contraceptive: Secondary | ICD-10-CM | POA: Diagnosis not present

## 2023-09-22 ENCOUNTER — Encounter: Payer: Self-pay | Admitting: Family Medicine

## 2023-09-25 ENCOUNTER — Ambulatory Visit: Admitting: Family Medicine

## 2023-09-25 ENCOUNTER — Encounter: Payer: Self-pay | Admitting: Family Medicine

## 2023-09-25 ENCOUNTER — Other Ambulatory Visit: Payer: Self-pay | Admitting: Family Medicine

## 2023-09-25 ENCOUNTER — Ambulatory Visit: Payer: Self-pay | Admitting: Family Medicine

## 2023-09-25 VITALS — BP 128/80 | HR 83 | Temp 98.0°F | Resp 16 | Ht 64.0 in | Wt 161.0 lb

## 2023-09-25 DIAGNOSIS — R413 Other amnesia: Secondary | ICD-10-CM

## 2023-09-25 DIAGNOSIS — Z7984 Long term (current) use of oral hypoglycemic drugs: Secondary | ICD-10-CM

## 2023-09-25 DIAGNOSIS — E1165 Type 2 diabetes mellitus with hyperglycemia: Secondary | ICD-10-CM | POA: Diagnosis not present

## 2023-09-25 DIAGNOSIS — Z0189 Encounter for other specified special examinations: Secondary | ICD-10-CM

## 2023-09-25 DIAGNOSIS — I10 Essential (primary) hypertension: Secondary | ICD-10-CM

## 2023-09-25 LAB — CBC
HCT: 47 % — ABNORMAL HIGH (ref 36.0–46.0)
Hemoglobin: 15.7 g/dL — ABNORMAL HIGH (ref 12.0–15.0)
MCHC: 33.4 g/dL (ref 30.0–36.0)
MCV: 91.9 fl (ref 78.0–100.0)
Platelets: 314 10*3/uL (ref 150.0–400.0)
RBC: 5.11 Mil/uL (ref 3.87–5.11)
RDW: 14.4 % (ref 11.5–15.5)
WBC: 14.1 10*3/uL — ABNORMAL HIGH (ref 4.0–10.5)

## 2023-09-25 LAB — MICROALBUMIN / CREATININE URINE RATIO
Creatinine,U: 88.1 mg/dL
Microalb Creat Ratio: 55.4 mg/g — ABNORMAL HIGH (ref 0.0–30.0)
Microalb, Ur: 4.9 mg/dL — ABNORMAL HIGH (ref 0.0–1.9)

## 2023-09-25 LAB — LIPID PANEL
Cholesterol: 191 mg/dL (ref 0–200)
HDL: 51.7 mg/dL (ref >39.00)
LDL Cholesterol: 97 mg/dL (ref 0–99)
NonHDL: 139.71
Total CHOL/HDL Ratio: 4
Triglycerides: 214 mg/dL — ABNORMAL HIGH (ref 0.0–149.0)
VLDL: 42.8 mg/dL — ABNORMAL HIGH (ref 0.0–40.0)

## 2023-09-25 LAB — COMPREHENSIVE METABOLIC PANEL WITH GFR
ALT: 17 U/L (ref 0–35)
AST: 16 U/L (ref 0–37)
Albumin: 4.7 g/dL (ref 3.5–5.2)
Alkaline Phosphatase: 64 U/L (ref 39–117)
BUN: 19 mg/dL (ref 6–23)
CO2: 20 meq/L (ref 19–32)
Calcium: 9.2 mg/dL (ref 8.4–10.5)
Chloride: 107 meq/L (ref 96–112)
Creatinine, Ser: 0.61 mg/dL (ref 0.40–1.20)
GFR: 103.97 mL/min (ref >60.00)
Glucose, Bld: 102 mg/dL — ABNORMAL HIGH (ref 70–99)
Potassium: 4 meq/L (ref 3.5–5.1)
Sodium: 139 meq/L (ref 135–145)
Total Bilirubin: 0.4 mg/dL (ref 0.2–1.2)
Total Protein: 7.2 g/dL (ref 6.0–8.3)

## 2023-09-25 LAB — HEMOGLOBIN A1C: Hgb A1c MFr Bld: 6.9 % — ABNORMAL HIGH (ref 4.6–6.5)

## 2023-09-25 MED ORDER — ATORVASTATIN CALCIUM 40 MG PO TABS: ORAL_TABLET | ORAL | 1 refills | Status: AC

## 2023-09-25 NOTE — Progress Notes (Signed)
 Subjective:   Chief Complaint  Patient presents with   Medication Problem    Discuss Depo Shot    Jenisha Pelto is a 53 y.o. female here for follow-up of diabetes.   Cambryn's self monitored glucose range is mid-high 100's.  Patient denies hypoglycemic reactions. She checks her glucose levels intermittently. Patient does not require insulin.   Medications include: Jardiance  25 mg daily, metformin  XR 1000 mg daily Diet is okay.  Exercise: Walking No chest pain or shortness of breath.  Hypertension Patient presents for hypertension follow up. She does nap monitor home blood pressures. She is compliant with medications-Toprol -XL 50 mg daily, Benicar  20 mg daily. Patient has these side effects of medication: none Diet/exercise as above.  Over the past several months, the patient has had worsening vision, word finding difficulty, and memory issues.  She also has frontal headaches.  She has been on the Depo injection from her GYN over the past 20 years.  She is concerned there may be some issues related to that.  Past Medical History:  Diagnosis Date   Diabetes mellitus type 2 in obese 02/17/2017   Hypertension      Related testing: Retinal exam: Due Pneumovax: not done  Objective:  BP 128/80 (BP Location: Left Arm, Patient Position: Sitting)   Pulse 83   Temp 98 F (36.7 C) (Oral)   Resp 16   Ht 5\' 4"  (1.626 m)   Wt 161 lb (73 kg)   SpO2 98%   BMI 27.64 kg/m  General:  Well developed, well nourished, in no apparent distress Skin:  Warm, no pallor or diaphoresis Head:  Normocephalic, atraumatic Eyes:  Pupils equal and round, sclera anicteric without injection  Lungs:  CTAB, no access msc use Cardio:  RRR, no bruits, no LE edema Musculoskeletal:  Symmetrical muscle groups noted without atrophy or deformity Neuro:  Sensation intact to pinprick on feet Psych: Age appropriate judgment and insight  Assessment:   Memory loss - Plan: MR Brain Wo Contrast  Type 2  diabetes mellitus with hyperglycemia, without long-term current use of insulin (HCC) - Plan: atorvastatin  (LIPITOR) 40 MG tablet, CBC, Comprehensive metabolic panel with GFR, Lipid panel, Hemoglobin A1c, Microalbumin / creatinine urine ratio  Essential hypertension   Plan:   Check MRI brain.   Chronic, hopefully controlled.  For now continue metformin  XR 1000 mg daily, Jardiance  25 mg daily.  Will consider Rybelsus or Mounjaro  if not controlled.  Counseled on diet and exercise. Chronic, stable.  Continue Benicar  20 mg daily, metoprolol  XL 50 mg daily. F/u in 3-6 mo. The patient voiced understanding and agreement to the plan.  Shellie Dials [place], DO 09/25/23 12:42 PM

## 2023-09-25 NOTE — Patient Instructions (Signed)
 Give us  2-3 business days to get the results of your labs back.   Keep the diet clean and stay active.  Someone will reach out regarding your scan.   Let us  know if you need anything.

## 2023-09-26 ENCOUNTER — Ambulatory Visit

## 2023-09-26 ENCOUNTER — Other Ambulatory Visit: Payer: Self-pay

## 2023-09-26 DIAGNOSIS — E782 Mixed hyperlipidemia: Secondary | ICD-10-CM

## 2023-09-26 DIAGNOSIS — Z0189 Encounter for other specified special examinations: Secondary | ICD-10-CM | POA: Diagnosis not present

## 2023-09-26 DIAGNOSIS — R809 Proteinuria, unspecified: Secondary | ICD-10-CM

## 2023-09-26 DIAGNOSIS — R413 Other amnesia: Secondary | ICD-10-CM | POA: Diagnosis not present

## 2023-09-26 DIAGNOSIS — D72829 Elevated white blood cell count, unspecified: Secondary | ICD-10-CM

## 2023-09-26 DIAGNOSIS — Z1389 Encounter for screening for other disorder: Secondary | ICD-10-CM | POA: Diagnosis not present

## 2023-09-27 ENCOUNTER — Other Ambulatory Visit: Payer: Self-pay | Admitting: Family Medicine

## 2023-09-27 DIAGNOSIS — E237 Disorder of pituitary gland, unspecified: Secondary | ICD-10-CM

## 2023-10-01 ENCOUNTER — Other Ambulatory Visit: Payer: Self-pay | Admitting: Family Medicine

## 2023-10-03 ENCOUNTER — Ambulatory Visit

## 2023-10-03 DIAGNOSIS — E237 Disorder of pituitary gland, unspecified: Secondary | ICD-10-CM | POA: Diagnosis not present

## 2023-10-03 DIAGNOSIS — E236 Other disorders of pituitary gland: Secondary | ICD-10-CM | POA: Diagnosis not present

## 2023-10-03 DIAGNOSIS — R413 Other amnesia: Secondary | ICD-10-CM | POA: Diagnosis not present

## 2023-10-03 MED ORDER — GADOBUTROL 1 MMOL/ML IV SOLN
7.3000 mL | Freq: Once | INTRAVENOUS | Status: AC | PRN
  Administered 2023-10-03: 7.3 mL via INTRAVENOUS

## 2023-10-04 ENCOUNTER — Other Ambulatory Visit: Payer: Self-pay | Admitting: Family Medicine

## 2023-10-09 ENCOUNTER — Encounter: Payer: Self-pay | Admitting: Family Medicine

## 2023-10-10 ENCOUNTER — Ambulatory Visit: Payer: Self-pay | Admitting: Family Medicine

## 2023-10-10 ENCOUNTER — Telehealth: Payer: Self-pay

## 2023-10-10 DIAGNOSIS — D352 Benign neoplasm of pituitary gland: Secondary | ICD-10-CM | POA: Diagnosis not present

## 2023-10-10 NOTE — Telephone Encounter (Signed)
 Copied from CRM [id number]. Topic: Clinical - Lab/Test Results >> Oct 10, 2023  1:10 PM Mercedes MATSU wrote: Reason for CRM: Patient called in stating that she would like a call back in regards to her MRI results. Once results are in she would like a call back to [phone].

## 2023-10-11 ENCOUNTER — Other Ambulatory Visit: Payer: Self-pay | Admitting: Family Medicine

## 2023-10-11 MED ORDER — BUPROPION HCL ER (SMOKING DET) 150 MG PO TB12: ORAL_TABLET | ORAL | 0 refills | Status: AC

## 2023-10-12 DIAGNOSIS — Z3042 Encounter for surveillance of injectable contraceptive: Secondary | ICD-10-CM | POA: Diagnosis not present

## 2023-10-16 ENCOUNTER — Telehealth: Payer: Self-pay

## 2023-10-16 NOTE — Telephone Encounter (Signed)
 Initial Comment Caller states she needs an appt. She is experiencing Sx. of sinusitis that is causing pain in her jawline, cheeks, and nasal area. Translation No Disp. Time Titus Time) Disposition Final User 10/16/2023 10:27:17 AM General Information Provided Yes Brooke Medina, Brooke Medina Final Disposition 10/16/2023 10:27:17 AM General Information Provided Yes Brooke Medina, Brooke Medina

## 2023-10-16 NOTE — Telephone Encounter (Signed)
Appt tomorrow w/ PCP.

## 2023-10-17 ENCOUNTER — Ambulatory Visit: Admitting: Family Medicine

## 2023-10-17 ENCOUNTER — Encounter: Payer: Self-pay | Admitting: Family Medicine

## 2023-10-17 VITALS — BP 120/80 | HR 90 | Temp 98.0°F | Resp 16 | Ht 64.0 in | Wt 165.2 lb

## 2023-10-17 DIAGNOSIS — J0100 Acute maxillary sinusitis, unspecified: Secondary | ICD-10-CM

## 2023-10-17 DIAGNOSIS — I10 Essential (primary) hypertension: Secondary | ICD-10-CM

## 2023-10-17 DIAGNOSIS — E237 Disorder of pituitary gland, unspecified: Secondary | ICD-10-CM

## 2023-10-17 MED ORDER — OLMESARTAN MEDOXOMIL 20 MG PO TABS: ORAL_TABLET | ORAL | 5 refills | Status: AC

## 2023-10-17 MED ORDER — DOXYCYCLINE HYCLATE 100 MG PO TABS: 100.0000 mg | ORAL_TABLET | Freq: Two times a day (BID) | ORAL | 0 refills | Status: DC

## 2023-10-17 MED ORDER — PREDNISONE 20 MG PO TABS: 40.0000 mg | ORAL_TABLET | Freq: Every day | ORAL | 0 refills | Status: AC

## 2023-10-17 NOTE — Progress Notes (Signed)
 Chief Complaint  Patient presents with   Facial Pain    Sinus Pain    Myelle Poteat here for URI complaints.  Duration: 9 days  Associated symptoms: sinus pain, ear pain, sore throat, and dental pain, coughing Denies: sinus congestion, rhinorrhea, itchy watery eyes, ear drainage, wheezing, shortness of breath, myalgia, and fevers Treatment to date: Tylenol, ibuprofen Sick contacts: No  Past Medical History:  Diagnosis Date   Diabetes mellitus type 2 in obese 02/17/2017   Hypertension     Objective BP 120/80 (BP Location: Left Arm, Patient Position: Sitting)   Pulse 90   Temp 98 F (36.7 C) (Oral)   Resp 16   Ht 5' 4 (1.626 m)   Wt 165 lb 3.2 oz (74.9 kg)   SpO2 98%   BMI 28.36 kg/m  General: Awake, alert, appears stated age HEENT: AT, [place], ears patent b/l and TM's neg, nares patent w/o discharge, pharynx pink and without exudates, MMM, ttp over max sinus on the R Neck: No masses or asymmetry Heart: RRR Lungs: CTAB, no accessory muscle use Psych: Age appropriate judgment and insight, normal mood and affect  Acute maxillary sinusitis, recurrence not specified  Pituitary abnormality (HCC) - Plan: Ambulatory referral to Neurosurgery  Essential hypertension - Plan: olmesartan  (BENICAR ) 20 MG tablet  5 d pred burst 40 mg/d. If no better, will take doxy for 7 d. Continue to push fluids, practice good hand hygiene, cover mouth when coughing. F/u prn. If starting to experience fevers, shaking, or shortness of breath, seek immediate care. Refer NS for their opinion.  Pt voiced understanding and agreement to the plan.  Mabel [place], DO 10/17/23 3:33 PM

## 2023-10-17 NOTE — Patient Instructions (Signed)
 Continue to push fluids, practice good hand hygiene, and cover your mouth if you cough.  If you start having fevers, shaking or shortness of breath, seek immediate care.  Start with the prednisone . If no better in 2 days, take the doxycycline.   Let us  know if you need anything.

## 2023-11-01 ENCOUNTER — Encounter: Payer: Self-pay | Admitting: Family Medicine

## 2023-11-06 ENCOUNTER — Other Ambulatory Visit

## 2023-11-08 ENCOUNTER — Encounter: Payer: Self-pay | Admitting: Family Medicine

## 2023-11-13 ENCOUNTER — Other Ambulatory Visit: Payer: Self-pay | Admitting: Family Medicine

## 2023-11-13 MED ORDER — METOPROLOL SUCCINATE ER 50 MG PO TB24: 50.0000 mg | ORAL_TABLET | Freq: Every day | ORAL | 5 refills | Status: DC

## 2023-11-13 NOTE — Telephone Encounter (Signed)
 Copied from CRM [id number]. Topic: Clinical - Medication Refill >> Nov 13, 2023 10:57 AM Suzen RAMAN wrote: Medication: metoprolol  succinate (TOPROL -XL) 50 MG 24 hr tablet  Has the patient contacted their pharmacy? Yes  This is the patient's preferred pharmacy:  [id number] - [place], [place] - [address] [place] [address] Phone: [phone] Fax: [phone]  Is this the correct pharmacy for this prescription? Yes If no, delete pharmacy and type the correct one.   Has the prescription been filled recently? No  Is the patient out of the medication? Yes  Has the patient been seen for an appointment in the last year OR does the patient have an upcoming appointment? Yes  Can we respond through MyChart? Yes  Agent: Please be advised that Rx refills may take up to 3 business days. We ask that you follow-up with your pharmacy.

## 2023-11-14 DIAGNOSIS — Z6826 Body mass index (BMI) 26.0-26.9, adult: Secondary | ICD-10-CM | POA: Diagnosis not present

## 2023-11-14 DIAGNOSIS — D497 Neoplasm of unspecified behavior of endocrine glands and other parts of nervous system: Secondary | ICD-10-CM | POA: Diagnosis not present

## 2023-11-23 DIAGNOSIS — J329 Chronic sinusitis, unspecified: Secondary | ICD-10-CM | POA: Diagnosis not present

## 2024-01-03 DIAGNOSIS — Z3042 Encounter for surveillance of injectable contraceptive: Secondary | ICD-10-CM | POA: Diagnosis not present

## 2024-03-15 ENCOUNTER — Encounter (HOSPITAL_BASED_OUTPATIENT_CLINIC_OR_DEPARTMENT_OTHER): Payer: Self-pay

## 2024-03-15 ENCOUNTER — Other Ambulatory Visit: Payer: Self-pay

## 2024-03-15 ENCOUNTER — Emergency Department (HOSPITAL_BASED_OUTPATIENT_CLINIC_OR_DEPARTMENT_OTHER)
Admission: EM | Admit: 2024-03-15 | Discharge: 2024-03-15 | Disposition: A | Discharge status: Home / Self Care | Attending: Emergency Medicine | Admitting: Emergency Medicine

## 2024-03-15 ENCOUNTER — Emergency Department (HOSPITAL_BASED_OUTPATIENT_CLINIC_OR_DEPARTMENT_OTHER)

## 2024-03-15 DIAGNOSIS — J069 Acute upper respiratory infection, unspecified: Secondary | ICD-10-CM | POA: Diagnosis not present

## 2024-03-15 DIAGNOSIS — Z91040 Latex allergy status: Secondary | ICD-10-CM | POA: Insufficient documentation

## 2024-03-15 DIAGNOSIS — R058 Other specified cough: Secondary | ICD-10-CM | POA: Diagnosis not present

## 2024-03-15 DIAGNOSIS — H9202 Otalgia, left ear: Secondary | ICD-10-CM | POA: Insufficient documentation

## 2024-03-15 DIAGNOSIS — R059 Cough, unspecified: Secondary | ICD-10-CM | POA: Diagnosis not present

## 2024-03-15 LAB — RESP PANEL BY RT-PCR (RSV, FLU A&B, COVID)  RVPGX2
Influenza A by PCR: NEGATIVE
Influenza B by PCR: NEGATIVE
Resp Syncytial Virus by PCR: NEGATIVE
SARS Coronavirus 2 by RT PCR: NEGATIVE

## 2024-03-15 MED ORDER — GUAIFENESIN-DM 100-10 MG/5ML PO SYRP: 5.0000 mL | ORAL_SOLUTION | Freq: Three times a day (TID) | ORAL | 0 refills | Status: DC | PRN

## 2024-03-15 MED ORDER — OXYCODONE-ACETAMINOPHEN 5-325 MG PO TABS: 1.0000 | ORAL_TABLET | Freq: Four times a day (QID) | ORAL | 0 refills | Status: DC | PRN

## 2024-03-15 NOTE — ED Provider Notes (Signed)
 [place] [facility] [facility] Provider Note   CSN: [id number] Arrival date & time: 03/15/24  1152     Patient presents with: Cough   Brooke Medina is a 53 y.o. female.    Cough Patient congestion and cough.  Has had for around 3 weeks.  No fevers.  No current production of those had previous production with the cough.  Is a smoker but no history of COPD.  States she had taken some steroids and some inhaler that she had around the house. Also with left ear pain.  Worried about infection.     Prior to Admission medications   Medication Sig Start Date End Date Taking? Authorizing Provider  oxyCODONE-acetaminophen (PERCOCET/ROXICET) 5-325 MG tablet Take 1 tablet by mouth every 6 (six) hours as needed for severe pain (pain score 7-10). 03/15/24  Yes Patsey Lot, MD  ALPRAZolam (XANAX) 0.5 MG tablet Take 0.5 mg at bedtime as needed by mouth for anxiety.    [provider]  atorvastatin  (LIPITOR) 40 MG tablet TAKE 1 TABLET(40 MG) BY MOUTH DAILY 09/25/23   Wendling, Mabel Mt, DO  buPROPion  (ZYBAN ) 150 MG 12 hr tablet 1 week before quit date, take 1 tab daily for 3 days and then 1 tab twice daily. 10/11/23   Frann Mabel Mt, DO  Continuous Blood Gluc Receiver (FREESTYLE LIBRE 2 READER) DEVI 1 Device by Does not apply route as directed. 11/04/19   Frann Mabel Mt, DO  Continuous Blood Gluc Sensor (FREESTYLE LIBRE 2 SENSOR) MISC 1 Device by Does not apply route as directed. 06/19/20   Wendling, Mabel Mt, DO  guaiFENesin-dextromethorphan (ROBITUSSIN DM) 100-10 MG/5ML syrup Take 5 mLs by mouth 3 (three) times daily as needed for cough. 03/15/24   Patsey Lot, MD  JARDIANCE  25 MG TABS tablet TAKE 1 TABLET(25 MG) BY MOUTH DAILY 10/04/23   Frann, Mabel Mt, DO  medroxyPROGESTERone  (DEPO-PROVERA ) 150 MG/ML injection Inject 150 mg into the muscle every 3 (three) months.    [provider]  metFORMIN  (GLUCOPHAGE -XR)  500 MG 24 hr tablet TAKE 2 TABLETS(1000 MG) BY MOUTH DAILY WITH BREAKFAST 10/02/23   Wendling, Mabel Mt, DO  metoprolol  succinate (TOPROL -XL) 50 MG 24 hr tablet Take 1 tablet (50 mg total) by mouth daily. Take with or immediately following a meal 11/13/23   Wendling, Mabel Mt, DO  olmesartan  (BENICAR ) 20 MG tablet TAKE 1 TABLET(20 MG) BY MOUTH DAILY 10/17/23   Frann, Mabel Mt, DO  pantoprazole  (PROTONIX ) 40 MG tablet Take 1 tablet (40 mg total) by mouth daily. 08/11/22   Frann Mabel Mt, DO    Allergies: Latex, Penicillins, and Sulfa antibiotics    Review of Systems  Respiratory:  Positive for cough.     Updated Vital Signs BP (!) 156/100 (BP Location: Right Arm)   Pulse 90   Temp 99.1 F (37.3 C) (Oral)   Resp 16   SpO2 97%   Physical Exam Vitals and nursing note reviewed.  HENT:     Right Ear: Tympanic membrane normal.     Left Ear: Tympanic membrane normal.  Cardiovascular:     Rate and Rhythm: Normal rate and regular rhythm.  Pulmonary:     Breath sounds: No wheezing or rhonchi.  Musculoskeletal:     Right lower leg: No edema.     Left lower leg: No edema.  Neurological:     Mental Status: She is alert.     (all labs ordered are listed, but only abnormal results are  displayed) Labs Reviewed  RESP PANEL BY RT-PCR (RSV, FLU A&B, COVID)  [id number]    EKG: None  Radiology: DG Chest 2 View Result Date: 03/15/2024 CLINICAL DATA:  Productive cough for 2 weeks. EXAM: CHEST - 2 VIEW COMPARISON:  None Available. FINDINGS: The heart size and mediastinal contours are within normal limits. Both lungs are clear. The visualized skeletal structures are unremarkable. IMPRESSION: No active cardiopulmonary disease. Electronically Signed   By: Norleen DELENA Kil M.D.   On: 03/15/2024 12:57     Procedures   Medications Ordered in the ED - No data to display                                  Medical Decision Making Amount and/or Complexity of Data  Reviewed Radiology: ordered.  Risk OTC drugs. Prescription drug management.   Patient with URI symptoms cough.  Differential diagnose includes viral syndrome, pneumonia, bronchitis.  X-ray reassuring.  Shows no pneumonia.  No focal findings on exam.  Do not think we need antibiotics for this time.  Left TM also reassuring.  Do not think we need antibiotics.  I will give a short course of pain pills to help.  Can follow-up with her PCP.  Will discharge home.     Final diagnoses:  Upper respiratory tract infection, unspecified type  Left ear pain    ED Discharge Orders          Ordered    guaiFENesin-dextromethorphan (ROBITUSSIN DM) 100-10 MG/5ML syrup  3 times daily PRN,   Status:  Discontinued        03/15/24 1355    oxyCODONE-acetaminophen (PERCOCET/ROXICET) 5-325 MG tablet  Every 6 hours PRN        03/15/24 1355    guaiFENesin-dextromethorphan (ROBITUSSIN DM) 100-10 MG/5ML syrup  3 times daily PRN        03/15/24 1404               Patsey Lot, MD 03/15/24 1414

## 2024-03-15 NOTE — ED Notes (Signed)

## 2024-03-15 NOTE — ED Triage Notes (Signed)
 Reports congestion & productive cough X 2 weeks. Also reports left sided ear pain. Denies fever at home

## 2024-03-19 DIAGNOSIS — Z3042 Encounter for surveillance of injectable contraceptive: Secondary | ICD-10-CM | POA: Diagnosis not present

## 2024-03-22 ENCOUNTER — Encounter: Payer: Self-pay | Admitting: Family Medicine

## 2024-03-22 ENCOUNTER — Ambulatory Visit: Admitting: Family Medicine

## 2024-03-22 VITALS — BP 136/86 | HR 91 | Temp 98.0°F | Resp 16 | Ht 64.0 in | Wt 160.0 lb

## 2024-03-22 DIAGNOSIS — K219 Gastro-esophageal reflux disease without esophagitis: Secondary | ICD-10-CM | POA: Diagnosis not present

## 2024-03-22 DIAGNOSIS — J0100 Acute maxillary sinusitis, unspecified: Secondary | ICD-10-CM

## 2024-03-22 MED ORDER — PANTOPRAZOLE SODIUM 40 MG PO TBEC: 40.0000 mg | DELAYED_RELEASE_TABLET | Freq: Every day | ORAL | 3 refills | Status: AC

## 2024-03-22 MED ORDER — DOXYCYCLINE HYCLATE 100 MG PO TABS: 100.0000 mg | ORAL_TABLET | Freq: Two times a day (BID) | ORAL | 0 refills | Status: AC

## 2024-03-22 NOTE — Progress Notes (Signed)
 Chief Complaint  Patient presents with   Nasal Congestion    Nasal Congestion     Brooke Medina here for URI complaints.  Duration: 3 weeks  Associated symptoms: sinus congestion, sinus pain, rhinorrhea, ear pain, myalgia, and coughing Denies: itchy watery eyes, ear drainage, wheezing, shortness of breath, and dental pain Treatment to date: cough syrup, oxycodone , prednisone  Sick contacts: Yes- spouse  Past Medical History:  Diagnosis Date   Diabetes mellitus type 2 in obese 02/17/2017   Hypertension     Objective BP 136/86 (BP Location: Left Arm, Patient Position: Sitting)   Pulse 91   Temp 98 F (36.7 C) (Oral)   Resp 16   Ht 5' 4 (1.626 m)   Wt 160 lb (72.6 kg)   SpO2 95%   BMI 27.46 kg/m  General: Awake, alert, appears stated age HEENT: AT, [place], ears patent b/l and TM's neg, nares patent w/o discharge, pharynx pink and without exudates, MMM, +ttp over max sinuses b/l Neck: No masses or asymmetry Heart: RRR Lungs: CTAB, no accessory muscle use Psych: Age appropriate judgment and insight, normal mood and affect  Acute maxillary sinusitis, recurrence not specified - Plan: doxycycline  (VIBRA -TABS) 100 MG tablet  Gastroesophageal reflux disease, unspecified whether esophagitis present - Plan: pantoprazole  (PROTONIX ) 40 MG tablet  Given duration and worsening symptoms, will treat with antibiotics.  7 days of doxycycline  given penicillin allergy.  May check CT of the sinuses if not improving.  Continue to push fluids, practice good hand hygiene, cover mouth when coughing. F/u prn. If starting to experience fevers, shaking, or shortness of breath, seek immediate care. Pt voiced understanding and agreement to the plan.  Mabel Mt [place], DO 03/22/24 2:25 PM

## 2024-03-22 NOTE — Patient Instructions (Signed)
 Continue to push fluids, practice good hand hygiene, and cover your mouth if you cough. ? ?If you start having fevers, shaking or shortness of breath, seek immediate care. ? ?OK to take Tylenol 1000 mg (2 extra strength tabs) or 975 mg (3 regular strength tabs) every 6 hours as needed. ? ?Let us know if you need anything. ?

## 2024-04-01 DIAGNOSIS — N951 Menopausal and female climacteric states: Secondary | ICD-10-CM | POA: Diagnosis not present

## 2024-04-01 DIAGNOSIS — Z01419 Encounter for gynecological examination (general) (routine) without abnormal findings: Secondary | ICD-10-CM | POA: Diagnosis not present

## 2024-04-10 ENCOUNTER — Other Ambulatory Visit: Payer: Self-pay | Admitting: Family Medicine

## 2024-05-21 ENCOUNTER — Telehealth: Payer: Self-pay

## 2024-05-21 MED ORDER — METOPROLOL SUCCINATE ER 50 MG PO TB24: 50.0000 mg | ORAL_TABLET | Freq: Every day | ORAL | 0 refills | Status: AC

## 2024-05-21 NOTE — Telephone Encounter (Signed)
 Copied from CRM #[id number]. Topic: Clinical - Medication Refill >> May 21, 2024  4:13 PM Alexandria E wrote: Medication: metoprolol  succinate (TOPROL -XL) 50 MG 24 hr tablet  Has the patient contacted their pharmacy? Yes (Agent: If no, request that the patient contact the pharmacy for the refill. If patient does not wish to contact the pharmacy document the reason why and proceed with request.) (Agent: If yes, when and what did the pharmacy advise?)  This is the patient's preferred pharmacy:  [id number] - [place], [place] - [address] [place] [address] Phone: [phone] Fax: [phone]   Is this the correct pharmacy for this prescription? Yes If no, delete pharmacy and type the correct one.   Has the prescription been filled recently? No  Is the patient out of the medication? Yes  Has the patient been seen for an appointment in the last year OR does the patient have an upcoming appointment? Yes  Can we respond through MyChart? Yes  Agent: Please be advised that Rx refills may take up to 3 business days. We ask that you follow-up with your pharmacy.
